# Patient Record
Sex: Male | Born: 1955 | Race: Black or African American | Hispanic: No | Marital: Married | Smoking: Never smoker
Health system: Southern US, Community
[De-identification: ages and names within clinical notes are randomized; demographics above are authoritative.]

## PROBLEM LIST (undated history)

## (undated) DIAGNOSIS — N186 End stage renal disease: Secondary | ICD-10-CM

## (undated) DIAGNOSIS — M329 Systemic lupus erythematosus, unspecified: Secondary | ICD-10-CM

## (undated) DIAGNOSIS — B021 Zoster meningitis: Secondary | ICD-10-CM

## (undated) DIAGNOSIS — I10 Essential (primary) hypertension: Secondary | ICD-10-CM

## (undated) DIAGNOSIS — B029 Zoster without complications: Secondary | ICD-10-CM

## (undated) DIAGNOSIS — I729 Aneurysm of unspecified site: Secondary | ICD-10-CM

## (undated) DIAGNOSIS — G629 Polyneuropathy, unspecified: Secondary | ICD-10-CM

## (undated) DIAGNOSIS — H209 Unspecified iridocyclitis: Secondary | ICD-10-CM

## (undated) DIAGNOSIS — M1990 Unspecified osteoarthritis, unspecified site: Secondary | ICD-10-CM

## (undated) DIAGNOSIS — R42 Dizziness and giddiness: Secondary | ICD-10-CM

## (undated) DIAGNOSIS — I2510 Atherosclerotic heart disease of native coronary artery without angina pectoris: Secondary | ICD-10-CM

## (undated) DIAGNOSIS — D649 Anemia, unspecified: Secondary | ICD-10-CM

## (undated) HISTORY — DX: End stage renal disease: N18.6

## (undated) HISTORY — PX: COLONOSCOPY: SHX174

## (undated) HISTORY — PX: VASECTOMY: SHX75

## (undated) HISTORY — DX: Zoster meningitis: B02.1

## (undated) HISTORY — DX: Dizziness and giddiness: R42

## (undated) HISTORY — DX: Anemia, unspecified: D64.9

## (undated) HISTORY — PX: OTHER SURGICAL HISTORY: SHX169

## (undated) HISTORY — DX: Aneurysm of unspecified site: I72.9

## (undated) HISTORY — DX: Polyneuropathy, unspecified: G62.9

## (undated) HISTORY — DX: Zoster without complications: B02.9

## (undated) HISTORY — DX: Essential (primary) hypertension: I10

## (undated) HISTORY — DX: Unspecified iridocyclitis: H20.9

## (undated) HISTORY — DX: Unspecified osteoarthritis, unspecified site: M19.90

## (undated) HISTORY — DX: Systemic lupus erythematosus, unspecified: M32.9

---

## 2000-01-04 DIAGNOSIS — M329 Systemic lupus erythematosus, unspecified: Secondary | ICD-10-CM

## 2000-01-04 HISTORY — DX: Systemic lupus erythematosus, unspecified: M32.9

## 2000-08-09 ENCOUNTER — Encounter: Payer: Self-pay | Admitting: Family Medicine

## 2000-08-09 ENCOUNTER — Encounter: Admission: RE | Admit: 2000-08-09 | Discharge: 2000-08-09 | Payer: Self-pay | Admitting: Family Medicine

## 2000-11-17 ENCOUNTER — Ambulatory Visit (HOSPITAL_COMMUNITY): Admission: RE | Admit: 2000-11-17 | Discharge: 2000-11-17 | Payer: Self-pay | Admitting: Oncology

## 2000-11-17 ENCOUNTER — Encounter (INDEPENDENT_AMBULATORY_CARE_PROVIDER_SITE_OTHER): Payer: Self-pay | Admitting: Specialist

## 2000-12-19 ENCOUNTER — Ambulatory Visit (HOSPITAL_BASED_OUTPATIENT_CLINIC_OR_DEPARTMENT_OTHER): Admission: RE | Admit: 2000-12-19 | Discharge: 2000-12-19 | Payer: Self-pay | Admitting: General Surgery

## 2000-12-19 ENCOUNTER — Encounter (INDEPENDENT_AMBULATORY_CARE_PROVIDER_SITE_OTHER): Payer: Self-pay | Admitting: *Deleted

## 2001-04-16 ENCOUNTER — Encounter (HOSPITAL_COMMUNITY): Payer: Self-pay | Admitting: Oncology

## 2001-04-16 ENCOUNTER — Ambulatory Visit (HOSPITAL_COMMUNITY): Admission: RE | Admit: 2001-04-16 | Discharge: 2001-04-16 | Payer: Self-pay | Admitting: Oncology

## 2001-04-19 ENCOUNTER — Encounter (INDEPENDENT_AMBULATORY_CARE_PROVIDER_SITE_OTHER): Payer: Self-pay | Admitting: Specialist

## 2001-04-19 ENCOUNTER — Ambulatory Visit (HOSPITAL_COMMUNITY): Admission: RE | Admit: 2001-04-19 | Discharge: 2001-04-19 | Payer: Self-pay | Admitting: Oncology

## 2001-04-19 ENCOUNTER — Encounter (HOSPITAL_COMMUNITY): Payer: Self-pay | Admitting: Oncology

## 2001-05-14 ENCOUNTER — Encounter: Payer: Self-pay | Admitting: Rheumatology

## 2001-05-14 ENCOUNTER — Encounter: Admission: RE | Admit: 2001-05-14 | Discharge: 2001-05-14 | Payer: Self-pay | Admitting: Rheumatology

## 2001-12-26 ENCOUNTER — Emergency Department (HOSPITAL_COMMUNITY): Admission: EM | Admit: 2001-12-26 | Discharge: 2001-12-26 | Payer: Self-pay | Admitting: Emergency Medicine

## 2001-12-26 ENCOUNTER — Encounter: Payer: Self-pay | Admitting: Emergency Medicine

## 2002-07-24 ENCOUNTER — Encounter: Payer: Self-pay | Admitting: Rheumatology

## 2002-07-24 ENCOUNTER — Encounter: Admission: RE | Admit: 2002-07-24 | Discharge: 2002-07-24 | Payer: Self-pay | Admitting: Rheumatology

## 2002-08-13 ENCOUNTER — Encounter: Admission: RE | Admit: 2002-08-13 | Discharge: 2002-08-13 | Payer: Self-pay | Admitting: Rheumatology

## 2002-08-13 ENCOUNTER — Encounter: Payer: Self-pay | Admitting: Rheumatology

## 2002-09-10 ENCOUNTER — Inpatient Hospital Stay (HOSPITAL_COMMUNITY): Admission: AD | Admit: 2002-09-10 | Discharge: 2002-09-13 | Payer: Self-pay | Admitting: Internal Medicine

## 2002-09-11 ENCOUNTER — Encounter: Payer: Self-pay | Admitting: Internal Medicine

## 2002-09-12 ENCOUNTER — Encounter: Payer: Self-pay | Admitting: Internal Medicine

## 2002-09-25 ENCOUNTER — Encounter: Admission: RE | Admit: 2002-09-25 | Discharge: 2002-09-25 | Payer: Self-pay | Admitting: Infectious Diseases

## 2002-10-02 ENCOUNTER — Encounter: Admission: RE | Admit: 2002-10-02 | Discharge: 2002-10-02 | Payer: Self-pay | Admitting: Infectious Diseases

## 2003-05-12 ENCOUNTER — Encounter: Admission: RE | Admit: 2003-05-12 | Discharge: 2003-05-12 | Payer: Self-pay | Admitting: Infectious Diseases

## 2003-11-26 ENCOUNTER — Ambulatory Visit (HOSPITAL_COMMUNITY): Admission: RE | Admit: 2003-11-26 | Discharge: 2003-11-26 | Payer: Self-pay | Admitting: Nephrology

## 2003-11-28 ENCOUNTER — Ambulatory Visit (HOSPITAL_COMMUNITY): Admission: RE | Admit: 2003-11-28 | Discharge: 2003-11-29 | Payer: Self-pay | Admitting: Nephrology

## 2003-11-28 ENCOUNTER — Encounter (INDEPENDENT_AMBULATORY_CARE_PROVIDER_SITE_OTHER): Payer: Self-pay | Admitting: *Deleted

## 2004-01-08 ENCOUNTER — Ambulatory Visit: Payer: Self-pay | Admitting: Infectious Diseases

## 2004-05-14 ENCOUNTER — Encounter (HOSPITAL_COMMUNITY): Admission: RE | Admit: 2004-05-14 | Discharge: 2004-08-12 | Payer: Self-pay | Admitting: Nephrology

## 2004-08-16 ENCOUNTER — Encounter (HOSPITAL_COMMUNITY): Admission: RE | Admit: 2004-08-16 | Discharge: 2004-11-14 | Payer: Self-pay | Admitting: Nephrology

## 2004-11-29 ENCOUNTER — Encounter (HOSPITAL_COMMUNITY): Admission: RE | Admit: 2004-11-29 | Discharge: 2005-02-27 | Payer: Self-pay | Admitting: Nephrology

## 2005-03-01 ENCOUNTER — Encounter (HOSPITAL_COMMUNITY): Admission: RE | Admit: 2005-03-01 | Discharge: 2005-05-30 | Payer: Self-pay | Admitting: Nephrology

## 2005-05-31 ENCOUNTER — Encounter (HOSPITAL_COMMUNITY): Admission: RE | Admit: 2005-05-31 | Discharge: 2005-08-29 | Payer: Self-pay | Admitting: Nephrology

## 2005-08-30 ENCOUNTER — Ambulatory Visit: Payer: Self-pay | Admitting: Infectious Diseases

## 2005-08-30 ENCOUNTER — Inpatient Hospital Stay (HOSPITAL_COMMUNITY): Admission: AD | Admit: 2005-08-30 | Discharge: 2005-09-05 | Payer: Self-pay | Admitting: Nephrology

## 2005-09-10 ENCOUNTER — Inpatient Hospital Stay (HOSPITAL_COMMUNITY): Admission: EM | Admit: 2005-09-10 | Discharge: 2005-09-16 | Payer: Self-pay | Admitting: Emergency Medicine

## 2005-09-13 ENCOUNTER — Ambulatory Visit: Payer: Self-pay | Admitting: Physical Medicine & Rehabilitation

## 2005-09-23 ENCOUNTER — Encounter (HOSPITAL_COMMUNITY): Admission: RE | Admit: 2005-09-23 | Discharge: 2005-09-23 | Payer: Self-pay | Admitting: Nephrology

## 2005-09-26 ENCOUNTER — Encounter: Admission: RE | Admit: 2005-09-26 | Discharge: 2005-10-24 | Payer: Self-pay | Admitting: Nephrology

## 2005-10-07 ENCOUNTER — Encounter (HOSPITAL_COMMUNITY): Admission: RE | Admit: 2005-10-07 | Discharge: 2006-01-05 | Payer: Self-pay | Admitting: Nephrology

## 2005-10-25 ENCOUNTER — Encounter: Admission: RE | Admit: 2005-10-25 | Discharge: 2005-11-11 | Payer: Self-pay | Admitting: Nephrology

## 2006-01-06 ENCOUNTER — Encounter (HOSPITAL_COMMUNITY): Admission: RE | Admit: 2006-01-06 | Discharge: 2006-04-06 | Payer: Self-pay | Admitting: Nephrology

## 2006-02-14 ENCOUNTER — Ambulatory Visit: Payer: Self-pay | Admitting: Vascular Surgery

## 2006-05-28 HISTORY — PX: AV FISTULA PLACEMENT: SHX1204

## 2006-05-31 ENCOUNTER — Ambulatory Visit: Payer: Self-pay | Admitting: Vascular Surgery

## 2006-05-31 ENCOUNTER — Ambulatory Visit (HOSPITAL_COMMUNITY): Admission: RE | Admit: 2006-05-31 | Discharge: 2006-05-31 | Payer: Self-pay | Admitting: Vascular Surgery

## 2006-06-23 ENCOUNTER — Encounter (HOSPITAL_COMMUNITY): Admission: RE | Admit: 2006-06-23 | Discharge: 2006-09-21 | Payer: Self-pay | Admitting: Internal Medicine

## 2006-07-25 ENCOUNTER — Ambulatory Visit: Payer: Self-pay | Admitting: Vascular Surgery

## 2006-09-25 ENCOUNTER — Encounter: Admission: RE | Admit: 2006-09-25 | Discharge: 2006-12-24 | Payer: Self-pay | Admitting: Internal Medicine

## 2006-12-09 ENCOUNTER — Emergency Department (HOSPITAL_COMMUNITY): Admission: EM | Admit: 2006-12-09 | Discharge: 2006-12-09 | Payer: Self-pay | Admitting: Emergency Medicine

## 2007-10-04 HISTORY — PX: FRACTURE SURGERY: SHX138

## 2009-04-05 ENCOUNTER — Emergency Department (HOSPITAL_COMMUNITY): Admission: EM | Admit: 2009-04-05 | Discharge: 2009-04-05 | Payer: Self-pay | Admitting: Emergency Medicine

## 2010-03-24 LAB — POCT I-STAT, CHEM 8
BUN: 48 mg/dL — ABNORMAL HIGH (ref 6–23)
Calcium, Ion: 1.16 mmol/L (ref 1.12–1.32)
Chloride: 104 mEq/L (ref 96–112)
Creatinine, Ser: 10.5 mg/dL — ABNORMAL HIGH (ref 0.4–1.5)
Glucose, Bld: 84 mg/dL (ref 70–99)
HCT: 36 % — ABNORMAL LOW (ref 39.0–52.0)
Hemoglobin: 12.2 g/dL — ABNORMAL LOW (ref 13.0–17.0)
Potassium: 4.9 mEq/L (ref 3.5–5.1)
Sodium: 139 mEq/L (ref 135–145)
TCO2: 30 mmol/L (ref 0–100)

## 2010-05-18 NOTE — Assessment & Plan Note (Signed)
OFFICE VISIT   Holquin, Neshawn L  DOB:  May 28, 1955                                       07/25/2006  CHART#:[id number]   Mr. Sleeper had an AV fistula in his left forearm (Cimino) created by me  on May 28 of this year.  The fistula is functioning nicely with an  excellent pulse and palpable thrill with no evidence of steal distally.  He does have lupus with some deformities of his fingers.  His vein is  enlarging nicely and should be excellent for use for dialysis if that  becomes necessary.  He will return to see [place] on a p.r.n. basis.   Nelda Severe Kellie Simmering, M.D.  Electronically Signed   JDL/MEDQ  D:  07/25/2006  T:  07/26/2006  Job:  [id number]   cc:   Darrold Span. Florene Glen, M.D.

## 2010-05-18 NOTE — Op Note (Signed)
NAME:  Stanley Stewart, Stanley Stewart          ACCOUNT NO.:  [id number]   MEDICAL RECORD NO.:  [id number]          PATIENT TYPE:  AMB   LOCATION:  SDS                          FACILITY:  [facility]   PHYSICIAN:  Nelda Severe. Kellie Simmering, M.D.  DATE OF BIRTH:  08/05/1955   DATE OF PROCEDURE:  05/31/2006  DATE OF DISCHARGE:                               OPERATIVE REPORT   PREOPERATIVE DIAGNOSIS:  End-stage renal disease.   POSTOPERATIVE DIAGNOSIS:  End-stage renal disease.   OPERATION:  Creation of a left radial artery to cephalic vein AV fistula  (Cimino shunt).   SURGEON:  Dr. Nelda Severe. Kellie Simmering.   FIRST ASSISTANT:  Faylene Million Dominick, PA.   ANESTHESIA:  Local.   PROCEDURE:  Patient taken to the operating room, placed in the supine  position at which time the left upper extremity was prepped with  Betadine scrub and solution and draped in a routine sterile manner.  After infiltration with 1% Xylocaine, a longitudinal incision was made  between the radial artery and cephalic vein at the wrist.  Cephalic vein  was dissected free, ligated distally and transected.  It was gently  dilated with heparinized saline and was a 2.5-3 mm vein distally.  Artery was exposed beneath the fascia, encircled with Vesseloops.  Artery was occluded with Vesseloops, opened with a 15 blade, extended  with Potts scissors.  It would easily accept a 2.5 mm dilator and had  excellent inflow.  Vein was carefully measured and spatulated,  anastomosed end-to-side with 6-0 Prolene.  Vesseloops released and there  was a good pulse and palpable thrill in the fistula.  The vein dilated  nicely two-thirds of the way up the forearm.  Adequate hemostasis was  achieved.  Wound closed in layers with Vicryl in a subcuticular fashion.  Sterile dressing applied.  The patient taken to the recovery in  satisfactory condition.      Nelda Severe Kellie Simmering, M.D.  Electronically Signed     JDL/MEDQ  D:  05/31/2006  T:  05/31/2006  Job:  [id number]

## 2010-05-21 NOTE — H&P (Signed)
NAMEAMOND, TOUTANT NO.:  [id number]   MEDICAL RECORD NO.:  [id number]          PATIENT TYPE:  INP   LOCATION:  [place]                         FACILITY:  [facility]   PHYSICIAN:  Sherril Croon, M.D.   DATE OF BIRTH:  Jul 07, 1955   DATE OF ADMISSION:  09/10/2005  DATE OF DISCHARGE:                                HISTORY & PHYSICAL   TIME OF ADMISSION:  7:00 in the evening.   This is a 55 year old African-American male discharged 09/06/2005 with  disseminated zoster infection treated with Valtrex, a 4-day history of  cramps, restless right lower leg pain, paresthesias, unable to walk, pain  unresponsive to Xanax described last night, had restless night, was  instructed to come to the emergency room for further evaluation, history of  systemic lupus, also a history cryptococcal meningitis, underwent a  __________  that showed pleomorphic cells positive for VZV on last CT scan  of August 29 which was negative.   PAST MEDICAL HISTORY:  1. Disseminated zoster 08/2005.  2. 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  3. History of anemia.  4. History of advanced renal failure.  5. History of cryptococcal meningitis.   MEDICATIONS:  1. Felodipine 19 mg q. day.  2. Prednisone 5 mg q. day.  3. Lasix 80 mg q. day.  4. Hydrochloroquine 200 mg b.i.d.  5. Valtrex 1 g daily.   ALLERGIES:  NO KNOWN DRUG ALLERGIES.   SOCIAL HISTORY:  Married, Jehovah's witness.  No alcohol, no tobacco.   FAMILY HISTORY:  No endstage renal disease.   REVIEW OF SYSTEMS:  GENERAL:  Denies fatigue, fever, sweats, chills.  EYES:  No visual complaints or visual loss.  EARS, NOSE, MOUTH THROAT:  No hearing  loss or epistaxis or sore throat.  CARDIOVASCULAR:  No anginal chest pain,  syncope orthopnea or PND.  RESPIRATORY:  No cough, wheeze, hemoptysis.  No  tobacco.  ABDOMEN:  No abdominal pain, nausea, vomiting or  diarrhea.  URO-  GENITAL:  No dysuria, urgency, frequency or hematuria.  NEUROLOGIC:  Positive for right-sided weakness, right-sided paresthesias.  DERMATOLOGY:  Skin rash right buttocks.  ENDOCRINE:  No diabetes, thyroid or adrenal  disease.  LUNGS:  Some steroid use.  MUSCULOSKELETAL:  Positive for lupus.  Has a bit forming arthropathy in the upper extremities.   PHYSICAL EXAMINATION:  GENERAL:  Alert, awake, follows commands  appropriately.  VITAL SIGNS:  Blood pressure 140/90, pulse of 75, temperature afebrile.  HEAD:  Normocephalic, atraumatic,, extraocular movements are intact.  Ears,  nose, back throat was normal and midline tongue.  NECK:  Supple.  No meningismus.  JVP not elevated.  CARDIOVASCULAR:  Regular rate and rhythm.  Faint systolic murmur 3/6 left  sternal edge, no rubs, no gallops.  RESPIRATORY:  Lungs fields are clear with no wheezes or rales.  ABDOMEN:  Soft, nontender with no organo-splenomegaly and normal bowel  sounds.  EXTREMITIES:  No cyanosis clubbing or edema.  DERMATOLOGIC:  Thin skin noted in the lower extremities.  NEUROLOGIC:  Pupillary response was equal and reactive.  Cranial nerves 2-6  were intact.  Face was midline with no weakness.  No sensory loss over the  facial dermatomes.  Cranial nerves 8, 9, 10, 11, 12 all intact.  EXTREMITIES:  Lower extremities revealed pathologically brisk reflexes in  the knees, ankles with upgoing Babinski's.  Vibration proprioception was  decreased in the right.  Sensory system was worse on the left than the right  to pinprick.   LABORATORY:  Pending.   ASSESSMENT/PLAN:  1. Appears to have acute spinal cord hemisection, level __________  lumbar      thoracic.  Will discuss the case with Dr. Gaynell Face.  He will evaluate      for MRI.  2. Hypertension, volume control.  3. Chronic renal failure, stage IV, no access.  no need for dialysis.  4. Anemia.  Aranesp 200 mcg weekly.  Check CBC __________  to       prescription.  Patient is noted to be Jehovah's Witness.      Sherril Croon, M.D.  Electronically Signed     MWW/MEDQ  D:  09/10/2005  T:  09/10/2005  Job:  [id number]

## 2010-05-21 NOTE — Consult Note (Signed)
NAME:  Stanley Stewart, MULLAN          ACCOUNT NO.:  [id number]   MEDICAL RECORD NO.:  [id number]          PATIENT TYPE:  INP   LOCATION:  3023                         FACILITY:  [facility]   PHYSICIAN:  Princess Bruins. Hickling, M.D.DATE OF BIRTH:  16-Dec-1955   DATE OF CONSULTATION:  09/10/2005  DATE OF DISCHARGE:                                   CONSULTATION   CHIEF COMPLAINT:  Jerky movements right leg, right leg weakness and  numbness.   [name] is a 55 year old gentleman with a five-year history of systemic  lupus erythematosus who has manifested itself with renal failure, rheumatoid  arthritis, anemia.  He has also had decreased resistance to infection.  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  He was recently hospitalized in late August with S1 zoster  and had a lumbar puncture that showed an elevated white count of 104, 7  polys, 65 lymphs, 25 monos, 3 eosinophils, red blood cell count 2.  Glucose  and protein were not done.  The patient had IgG, CSF herpes simplex 1-2 of  3.95 with index of less than 1.1 being in the normal range.  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  Herpes simplex 2 PCR was less than 0.9  which is in the normal range.  Suggested a herpes virus at some time in the  past.   The patient also has had significant problems with renal failure with  creatinine up to 5 in early August, having risen from 3.9 in July 12, 2005.  Smith antibody elevated at 601, RNP antibody at 156.  Anti-DNA elevated at  80 double-strand DNA elevated at 424, compliment was low with C3 68 and C4  13.   The patient was treated with intravenous acyclovir and was transitioned to  oral Valtrex after a couple of days.  Prednisone was continued and Plaquenil  was also continued.  The patient was given Aranesp for anemia of chronic  disease.  The patient's kidney function remained stable 4.9-5.0 range for  creatinine.   The  patient was discharged home September 05, 2005.  Two days later, he began  to experience myoclonic movements of his right leg and had increasing  difficulty walking on it.  He has had numbness at the sight of his zoster  and some pain in his leg which may be from repetitive flexion of the leg.   Medications include nifedipine 90 mg daily, prednisone 5 mg daily, Lasix 80  mg daily, hydroxychloroquine 200 mg twice a day (Plaquenil and Valtrex 1 gm  daily one more day treatment).   DRUG ALLERGIES:  None known.   Family history is unremarkable for lupus, end-stage renal disease, frequent  meningitis or myelitis.   SOCIAL HISTORY:  The patient is married.  He is a Jehovah witnessed.  Does  not use tobacco or alcohol.   REVIEW OF SYSTEMS:  The patient has not had fever, chills, no change in his  vision, no intercurrent infections in the head, neck, lungs, GI, GU.  He has  had no angina, syncope, orthopnea,  paroxysmal dyspnea.  No cough, no  wheezing.  He has had pain in his legs but no nausea, vomiting, diarrhea, or  dysuria and no problems with diabetes or thyroid disease.  Musculoskeletal  as noted above.   EXAMINATION:  Today a pleasant gentleman in no acute distress.  VITAL SIGNS:  Blood pressure 100/72, resting pulse 90, respirations 20,  oxygen saturation 97%, temperature 98.8.  NECK:  Supple.  No bruits.  No meningismus.  LUNGS:  Clear.  HEART:  No murmurs.  PULSES:  Normal.  ABDOMEN:  Soft.  Bowel sounds normal.  No hepatosplenomegaly.  EXTREMITIES:  The patient has significant rheumatoid deformity of his  fingers.  He has healing lesions in the S1 distribution of the right from  prior zoster.  MENTAL STATUS:  Alert without dysphasia.  CRANIAL NERVES:  Round reactive pupils.  Visual fields full.  Extraocular  movements full.  Symmetric facial strength.  Midline tongue.  Motor  examination normal strength in the upper extremities and left lower  extremity, 3/5 hip flexor, 4-/5  psoas in the right lower extremity, 5/5 knee  flexor, extensor, dorsiflexor and plantar flexor.  The patient has  hypesthesia in the right S1 dermatome.  No peripheral polyneuropathy.  No  sensory level with stereoagnosis.  Cerebellar examination:  Good finger-to-  nose.  Rapid, repetitive movements were slow.  Heel-knee-shin cannot be done  on the right.  Deep tendon reflexes were brisk.  There is extensive spread  from the right Achilles to the patella and across the adductor on the left.  The patient had bilateral flexor plantar responses, normal perianal  sensation, good cremasteric reflex and good rectal tone.   IMPRESSION:  The patient has weakness in his right lower extremity.  He has  hyperreflexia of the right Achilles without an extensor plantar response and  has evidence of right lower extremity segmental myoclonus.  There is no  signs of myelitis or plaquing.  The patient has not had seizures.  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  He is  not yet on dialysis.  Zoster was present in S1 dermatome and he had a  meningitis without encephalitis.  The patient has had cryptococcal  meningitis in the past.   PLAN:  1. EEG.  2. Will image the lumbosacral spine and thoracic spine with a MRI scan      without contrast, though I think it is probably a low yield study.  I      do not see a reason to treat his myoclonus at this time.  I know that      he is uncomfortable and it is causing some pain as well as insomnia.  I      am not certain that we can bring it under control at this time.  I      appreciate the opportunity to participate in his care.      Princess Bruins. Gaynell Face, M.D.  Electronically Signed     WHH/MEDQ  D:  09/10/2005  T:  09/11/2005  Job:  [id number]   cc:   Sherril Croon, M.D.

## 2010-05-21 NOTE — Op Note (Signed)
   NAME:  Stanley Stewart, Stanley Stewart                    ACCOUNT NO.:  [id number]   MEDICAL RECORD NO.:  [id number]                   PATIENT TYPE:  INP   LOCATION:  3039                                 FACILITY:  [facility]   PHYSICIAN:  Legrand Como L. Doy Mince, M.D.           DATE OF BIRTH:  06-Oct-1955   DATE OF PROCEDURE:  09/10/2002  DATE OF DISCHARGE:                                 OPERATIVE REPORT   PROCEDURE PERFORMED:  Diagnostic lumbar puncture.   OPERATOR:  Michael L. Doy Mince, M.D.   INDICATIONS FOR PROCEDURE:  Headache, fever, possible meningitis or  vasculitis.   DESCRIPTION OF PROCEDURE:  Informed consent was obtained after the  procedure, risks and benefits were explained to the patient and he agreed to  proceed.  The patient was placed in the right lateral decubitus position and  prepped and draped in the usual sterile fashion.  Local anesthesia was  achieved with 97mL of lidocaine.  A 20 gauge spinal needle was inserted into  the L4-5 interspace and advanced until clear CSF was returned.  Opening  pressure was measured at 290 mmH2O.  Approximately 67mL of CSF were obtained  and sent for laboratory analysis as follows:  tube #1 2mL cell count,  differential, cryptococcal antigen; tube #2 30mL, glucose and protein; tube  #3 24mL, Gram stain, routine culture, AFB stain and culture, fungal stain and  culture; tube #4 24mL, VDRL, any additional tests deemed necessary.  Closing  pressure was measured at 195 mmH2O.  Needle was withdrawn, hemostasis  obtained, no immediate complications were noted.  Patient was advised to  remain supine for three or four hours following the procedure and will be  monitored by floor nursing staff.                                               Michael L. Doy Mince, M.D.    MLR/MEDQ  D:  09/10/2002  T:  09/11/2002  Job:  [id number]

## 2010-05-21 NOTE — Discharge Summary (Signed)
NAME:  Stanley Stewart, Stanley Stewart          ACCOUNT NO.:  [id number]   MEDICAL RECORD NO.:  [id number]          PATIENT TYPE:  INP   LOCATION:  6702                         FACILITY:  [facility]   PHYSICIAN:  Alvin C. Florene Glen, M.D.  DATE OF BIRTH:  01-Jun-1955   DATE OF ADMISSION:  08/30/2005  DATE OF DISCHARGE:  09/05/2005                                 DISCHARGE SUMMARY   DISCHARGE DIAGNOSES:  1. Herpes zoster meningitis.  2. Systemic lupus erythematous.  3. Anemia of chronic disease.  4. Chronic kidney disease.   PROCEDURE:  1. 08/29/2005:  Lumbar puncture by Dr. Purcell Nails.  2. 08/30/2005:  CT of head without contrast.  Impression: 1.  No acute      intracranial abnormalities.  No mass lesion or midline shift.  2.      Moderate changes of small vessel disease of the white matter diffusely.   CONSULTATIONS:  1. Dr. Lars Mage.  2. Dr. Knute Neu.   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  He also has a history of Crytococcal meningitis in September of  2004 and was treated with Diflucan at that time.  He never had a repeat  lumbar puncture.  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  On May 05, 2005 the CellCept  was finally discontinued.  Diflucan was stopped simultaneously.  Patient has  been currently maintained on Prednisone at 5 mg per day alternating with 2.5  mg per day.  For several weeks, he has complained of increased malaise,  increased arthralgias, recent low grade temperature.  He was seen by Dr.  Rockwell Alexandria on August 8 and labs at that time revealed a serum creatinine of up  to 5.  It had been 3.9 on July 10.  Smith antibody was elevated at 601,  RMP  antibody was elevated at 156, anti-double strength DNA was elevated at 424.  C3 was low at 68 and C4 was 13.  He complained of a dull headache, some  discomfort in his neck and a feeling like he did before when he had  meningitis.  He complained of some nausea and vomiting this morning as well  as chills.   LABORATORY DATA:  WBC 5.7, hemoglobin 13.1, hematocrit 42.1, platelets  201,000.  Potassium 5.3, creatinine 4.9.   HOSPITAL COURSE:  1. Herpes zoster meningitis:  We received an Infectious Disease consult on      08/30/05 from Dr. Lars Mage.  At that time, a lumbar puncture was      performed and cultures showed no organisms and predominantly      mononuclear cells.  Patient also had a CT head with results above.      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      Urine culture was performed with no growth. Blood  cultures were      performed with no growth.  Recommendations included Acyclovir at 10 mg      per kilogram IV q.24h.  For his creatinine clearance that was      approximately 5-10 cc.  The patient did well with the therapy.  He      subsequently had no pain or irritation.  He was placed on p.o. Valtrex      on 9/2 and he will continue this for seven days.  Please note that this      is recommendations per Infectious Disease.  2. Systemic lupus erythematous:  Patient continued on his Plaquenil and      Prednisone at his usual outpatient dose.  Dr. Knute Neu was      consulted on August 31, 2005.  He agreed with cultures to include blood      cultures and CSF cultures which are stated in hospital course #1.  C3      and C4 counts were done.  C3 count was 55.  I do not have C4 count at      time of discharge.  Dr. Rockwell Alexandria did recommend continuing Prednisone at      his usual dose of 5 mg q. Day and Plaquenil 200 mg b.i.d.  He did not      see any clinical evidence for higher steroid dose at this time.  He      also agreed with  continuation of his IV Acyclovir as per Dr. Orene Desanctis.  It      was also noted that the patient's current presentation upon admission      was secondary to herpes zoster and he felt that he may also have some      active lupus flare given his joint and muscle pain.  3. Anemia of chronic disease:  Patient's hemoglobin remained stable in the      12s.  He received his weekly Aranesp injection.  He was also given IV      doses of iron this admission.  4. Chronic kidney disease:  The patient's serum creatinine stayed in the      4.9-5.0 range.  It was 5.1 on 09/04/05.  Patient was educated with      videos.  He did receive vein mapping to assess for further access.  The      vein mapping showed left cephalic patent without significant wall      thinning.  That was done on the left arm.  Again no further      intervention.   DISCHARGE MEDICATIONS:  The patient will continue his outpatient  medications.  New medications will include:  1. Valtrex 1 gram p.o. daily for seven days.  2. Darvocet N-100 one every four hours as needed for pain. (prescription      was given for #20).   DISCHARGE INSTRUCTIONS:  Patient did meet with the nutritionist here in the  hospital; however, I will contact one of the medical assistants at our  office and have them send over information on low potassium and low  phosphorus diets for the patient and his spouse to review.  He also needs to  followup with Dr. Florene Glen in one week.  I have given the receptionist a call  who will contact the patient with this appointment I believe on September  10.  He will increase his activity slowly and give the office a call if  there is any concerns or questions.  Of note, the patient did receive his  Aranesp injection prior to discharge which was one day earlier than  scheduled.      Leafy Kindle, PA      [place] C. Florene Glen, M.D.  Electronically Signed   MY/MEDQ  D:  09/06/2005  T:  09/06/2005  Job:  [id number]   cc:   Alison Murray, M.D.

## 2010-05-21 NOTE — Procedures (Signed)
EEG NUMBER:  [id number]   HISTORY:  This is a 55 year old with a history of right lower extremity  weakness and segmental myoclonus associated with a herpes zoster infection.  The patient is being evaluated for possible seizure-type events.  Medications include valacyclovir, prednisone, Lasix, Plaquenil, Xanax,  Procardia, Tylox.  This is a routine EEG.  No skull defects are noted.   EEG CLASSIFICATION:  Dysrhythmia grade 1, generalized.   DESCRIPTION OF RECORDING:  The background rhythm of this recording consists  of a fairly well modulated medium amplitude alpha rhythm of 8 Hz that is  reactive to eye opening and closure.  As the record progresses, intermittent  generalized theta frequency slowing is seen off and on.  Slowing occurs in 1-  to 2-second durations with normal-appearing activity between episodes of  slowing.  Photic stimulation is performed resulting in a bilateral and  symmetric photic driving response.  Hyperventilation is then performed,  resulting in a minimal buildup of background rhythm activities without much  alteration in the generalized slowing as described above.  At no time during  the recording does there appear to be evidence of spike or spike-wave  discharges or evidence of focal slowing.  EKG monitor shows no evidence of  cardiac rhythm abnormalities with a heart rate of 90.   IMPRESSION:  This is a mildly abnormal EEG recording due to intermittent  generalized slowing.  Such recording is nonspecific and can be seen with any  process resulting in a toxic or metabolic encephalopathy.  No clear  epileptiform discharges are seen.      Jill Alexanders, M.D.  Electronically Signed     [id number]  D:  09/12/2005 16:40:16  T:  09/13/2005 11:27:21  Job #:  [id number]

## 2010-05-21 NOTE — H&P (Signed)
NAME:  Stanley Stewart, Stanley Stewart          ACCOUNT NO.:  [id number]   MEDICAL RECORD NO.:  [id number]          PATIENT TYPE:  INP   LOCATION:  6702                         FACILITY:  [facility]   PHYSICIAN:  Alvin C. Florene Glen, M.D.  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  He never had a repeat lumbar puncture.  The patient  was treated with prednisone and subsequently CellCept through 2006 for lupus  nephritis and during that period of time received prophylactic therapy with  Diflucan.  On May 05, 2005, the CellCept was finally discontinued.  Diflucan  was stopped simultaneously.  The patient has been currently maintained on  prednisone at 5 mg per day, alternating with 2.5 mg per day.  For several  weeks, he has complained of increased malaise, increased arthralgias, recent  low grade temperature.  He was seen by Dr. Rockwell Alexandria on August 8th, and labs  at that time revealed a serum creatinine up to 5.  It had been 3.9 on July  10th.  Smith antibody was elevated at 601, RNP antibody was elevated at 156,  anti-double strength DNA was elevated at 424.  C3 was low at 68 (90-180) and  C4 was 13 (9-36).  He complains of a dull headache, some discomfort in his  neck, and a feeling like he did before when he had meningitis.  He  complained of some nausea and vomiting this morning as well as chills.   CURRENT MEDICATIONS:  1. Ferrous sulfate b.i.d.  2. Hydroxychloroquine 200 mg b.i.d.  3. Prednisone 5 mg alternating with 2.5 mg daily.  4. Furosemide 80 mg per day.  5. Nifedipine 90 mg per day.  6.  Calcium daily.  7. Vitamin C.  8. Multivitamins.   PAST MEDICAL HISTORY:  SLE, nephritis, cryptococcal meningitis in September,  2004, anemia.  He is a Restaurant manager, fast food.  Arthritis with deformities,  hypertension, peripheral neuropathy.   REVIEW OF SYSTEMS:  He also has complained of onset this morning of right  buttock rash that he felt when taking a shower.  It is nonpainful.   PHYSICAL EXAMINATION:  VITAL SIGNS:  Temperature 100.2 degrees, blood  pressure 148/100.  Weight is 138 pounds.  GENERAL:  A slightly ill-appearing African-American male.  HEENT:  There is slight nuchal rigidity.  LUNGS:  Clear.  HEART:  Regular rate and rhythm.  ABDOMEN:  Soft.  EXTREMITIES:  With deformities of the hands.  No significant edema.  SKIN:  Buttocks with multiple bullae and blisters on the right cheek in a  dermatomal distribution.  NEUROLOGIC:  He is oriented in all spheres.   ASSESSMENT:  1. Fever, headache, mild nuchal rigidity with a history of cryptococcal      meningitis, rule  out recurrent cryptococcal meningitis, rule out herpes      encephalitis.  2. Right buttock rash:  Rule out herpes zoster.  3. Renal failure secondary to SLE with question of flare.  4. Rule out systemic lupus erythematosus flare.  5. Anemia:  Continue Aranesp.  6. Jehovah's Witness, refuses blood transfusion.   PLAN:  1. Admit.  2. Infectious disease consult.  3. Rheumatology consult.  4. Lumbar puncture.      Stanley Stewart. Florene Glen, M.D.  Electronically Signed     ACP/MEDQ  D:  08/30/2005  T:  08/30/2005  Job:  [id number]
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
## 2010-05-21 NOTE — Op Note (Signed)
[place]. [facility]  Patient:    JESUSMANUEL, KENWORTHY Visit Number: [id number] MRN: [id number]          Service Type: DSU Location: [facility] Attending Physician:  Earl Gala Dictated by:   Rudell Cobb. Annamaria Boots, M.D. Proc. Date: 12/19/00 Admit Date:  12/19/2000   CC:         Jeanie Cooks, M.D.                           Operative Report  PREOPERATIVE DIAGNOSIS:  Lupus erythematosus and persistent fever along with generalized lymphadenopathy.  POSTOPERATIVE DIAGNOSIS:  Lupus erythematosus and persistent fever along with generalized lymphadenopathy.  PROCEDURE:  Left axillary lymph node biopsy.  SURGEON:  Rudell Cobb. Annamaria Boots, M.D.  ANESTHESIA:  General.  DESCRIPTION OF PROCEDURE:  The patient was placed on the operating table after general anesthesia had been induced.  The left arm extended on the arm board and the left axilla was prepped and draped in the usual fashion.  A transverse left axillary incision was made with dissection through the subcutaneous tissue through the clavipectoral fascia.  There was a large cluster of enlarged but benign-looking lymph nodes that was removed.  Hemostasis obtained with the cautery.  The long thoracic and thoracodorsal nerves were identified and preserved.  With good hemostasis, we closed the incision with 3-0 Vicryl and staples.  Specimen submitted to the pathologist in a fresh state. Dressing applied and the patient transferred to the recovery room in satisfactory condition, having tolerated the procedure well. Dictated by:   Rudell Cobb. Annamaria Boots, M.D. Attending Physician:  Earl Gala DD:  12/19/00 TD:  12/19/00 Job: [id number]

## 2010-05-21 NOTE — Consult Note (Signed)
NAME:  Stanley Stewart, Stanley Stewart                    ACCOUNT NO.:  [id number]   MEDICAL RECORD NO.:  [id number]                   PATIENT TYPE:  INP   LOCATION:  3039                                 FACILITY:  [place]   PHYSICIAN:  Legrand Como L. Doy Mince, M.D.           DATE OF BIRTH:  February 16, 1955   DATE OF CONSULTATION:  09/10/2002  DATE OF DISCHARGE:                                   CONSULTATION   REFERRING PHYSICIAN:  Ander Slade. Rockwell Alexandria, M.D./Karrar Lysle Rubens, M.D.   REASON FOR EVALUATION:  Headache.   HISTORY OF PRESENT ILLNESS:  This is an inpatient consultation evaluation of  this existing [facility] patient.  A 55 year old right-  handed man was seen on 2 previous occasions in the office by Dr. Leonie Man for  persistent headache.  The patient reports he has had a fairly severe  persistent headache going on for about 8 weeks.  It usually is frontal and  low level, but can often become very severe, at which times it becomes  global and is often associated with some stiffness of the neck.  With a  severe headache he also reports feeling some nausea, but does not experience  very much photophobia.  He also has a little bit of low back pain when the  neck is really bothering him.  He saw Dr. Rockwell Alexandria about this, who initially  prescribed Darvocet, which is only of some help.  Around the time he  developed the headache, he also began to have intermittent fevers in the 99  to 100 range.  He saw Dr. Rockwell Alexandria and was prescribed Darvocet, which was of  some help, but the headache persisted.  He also was tried on courses of  antibiotics, including Avalox after CT sinus imaging revealed some  abnormalities, but this did not help much.  He was evaluated by Dr. Leonie Man on  08/30/2002 and was felt to likely have analgesic redundant headache and was  advised to discontinue his analgesics including Darvocet and Excedrin.  He  was placed on amitriptyline.  However, his headaches continued to  worsen.  Over concern of his persistent headaches as well as intermittent fevers and  his immunocompromised state, he is admitted for further work-up and  diagnostic considerations.  Presently, he is resting fairly comfortably,  although he does complain of a frontal headache.  He is not presently  nauseous.   PAST MEDICAL HISTORY:  Remarkable for:  1. Systemic lupus erythematous, for which he is chronically followed by Dr.     Rockwell Alexandria.  This diagnosis was made in August 2002 and manifestations of it     include arthralgias, fatigues, bone marrow suppression, left pleural     effusion, proteinuria, and presence of anticardiolipin antibody.  2. He also has persistently elevated amylase levels without evidence of     pancreatitis.  3. He is treated for hypertension.   FAMILY AND SOCIAL AND REVIEW OF SYSTEMS:  Per ADMISSION H&P.  MEDICATIONS:  1. Chronically he takes Plaquenil 200 mg b.i.d.  2. Prednisone 7.5 mg q.d.  3. Diovan.  4. Norvasc.  5. Calcium and multivitamins.  6. He has also been received Ultracet for his headache and in the hospital     has received intravenous Demerol.   PHYSICAL EXAMINATION:  VITAL SIGNS:  Temperature 101.8.  Blood pressure  120/70.  Pulse 96.  Weight 148 lb.  GENERAL:  This is a thin somewhat ill appearing man in no evidence of  distress lying supine in the hospital bed.  HEAD:  Cranium is normocephalic and atraumatic.  Oropharynx is benign.  NECK:  Little bit stiff with restriction of forward flexion and complaint of  back pain with forward flexion, but there is no true to Kernig or Brudzinski  sign.  NEUROLOGIC:  Mental status:  He is awake, alert, and oriented.  Speech is  fluent and not dysarthric and is appropriate for content.  He has no defects  to confrontational naming.  Cranial nerves:  Funduscopic exam was benign.  Pupils equal and reactive.  Extraocular movements full without nystagmus.  Face, tongue, and palate move normally and  symmetrically.  Motor:  Normal  bulk and tone.  Normal strength of all tested extremity muscles.  Sensation:  Intact to light-touch throughout.  Reflexes 2+ and symmetric.  Toes are  downgoing.  Cerebellar:  Finger-to-nose is performed well.  Gait:  He  complains of a little bit dizziness and light-headedness, but he is  ambulatory and his gait is relatively steady.   LABORATORY REVIEW:  Pending laboratory studies at this time include:  CBC.  CMET.  CPK.  C3 anti-DNA antibody.  Sedimentation rate.  Throat culture for  beta strep.  Urinalysis, urine culture, and blood culture x 2.  Chest x-ray  and MRI to brain with and without contrast are also pending.   PERTINENT LABORATORY STUDIES:  Performed 08/28/2002 include:  Normal liver  enzymes and albumin.  Bottom level of normal compliment levels.  Normal  urinalysis.  Elevated anticardiolipin antibody titers.  Negative blood  culture x 2.  Normal renal function.  Low hemoglobin of 11.5 with white  count 4.4, and platelet count of 197,000.  Elevated sed rate of 66.  Positive double-stranded DNA antibody titer at 1:640.  Negative urine  culture.   IMPRESSION:  Persistent headache and intermittent fever in the setting of  chronic immunosuppression due to lupus.  Concerns are raised about possible  low-grade infection, possible vasculitis.  While lupus may be the source of  his fever, he also needs a work-up for fever of unknown origin.   PLAN:  We will proceed with diagnostic lumbar puncture as well as MRI of the  brain with contrast.  Depending on the results of these, he may need further  studies including transesophageal echo and the like.  We will defer that to  his primary neurologist, Dr. Leonie Man, who will be evaluating him tomorrow  morning.                                               Michael L. Doy Mince, M.D.    MLR/MEDQ  D:  09/10/2002  T:  09/11/2002  Job:  [id number]  cc:   Ander Slade. Rockwell Alexandria, M.D.  [address]  [place] [address]  Fax: [phone]  [name], M.D.  [address]  [place] [id number]  Fax: [phone]   [place]. Leonie Man, MD  Fax: [phone]

## 2010-05-21 NOTE — Discharge Summary (Signed)
NAMEHAITHAM, ELRICK NO.:  [id number]   MEDICAL RECORD NO.:  [id number]          PATIENT TYPE:  REC   LOCATION:  [facility]H                         FACILITY:  [place]   PHYSICIAN:  Sherril Croon, M.D.   DATE OF BIRTH:  11/19/1955   DATE OF ADMISSION:  09/26/2005  DATE OF DISCHARGE:  10/16/2005                                 DISCHARGE SUMMARY   ADMITTING DIAGNOSES:  1. Right lower extremity weakness.  2. Hyperreflexia.  3. Right lower extremity segmental myoclonus.  4. Chronic kidney disease stage IV.  5. Anemia of chronic disease.  6. Hypertension.  7. Systemic lupus erythematosus.  8. Disseminated zoster.  9. History of recent cryptococcal meningitis.   DISCHARGE DIAGNOSES:  1. Right lower extremity weakness and myoclonus secondary to sequelae,      varicella zoster virus treated with Depakote.  2. Chronic kidney disease stage IV.  3. Systemic lupus erythematosus.  4. Anemia of chronic disease.  5. History of recent cryptococcal meningitis.  6. Disseminated zoster.  7. Hypertension.   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  He is now  being admitted complaining of a restless right leg pain and paresthesias and  states he is unable to walk.  He has had these symptoms for 4 days along  with cramping.  His pain has been unresponsive to the Xanax he is been  taking at night.  He is admitted now for workup.   LABORATORY DATA:  Labs on admission:  White count 8300, hemoglobin 12.5,  platelets 345,000.  Sodium 132, potassium 5.3, chloride 102, CO2 is 22,  glucose 104, BUN 59, creatinine 4.7, albumin 2.7.  LFTs within normal  limits, phosphorus 4.5, TSH 0.568.   HOSPITAL COURSE:  An MRI was obtained on his thoracic lumbar spine.  There  were no abnormalities seen.  He did have changes of ordinary  spondylosis in  the cervical spine, but otherwise there was no cord lesion evident.  Neurology was consulted and felt that specifically the patient had weakness  of the right lower leg along with right hyperreflexia and segmental  myoclonus.  EEG was ordered, however, results are not in the chart at time  of this dictation.  He was started on Depakote at 250 mg b.i.d., and given  Tylox 1-2 tablets every 6 hours for pain control.  Other workup included  complement levels which were both low with C3 at 57 and C4 at 13.  Anti-DNA  was elevated at 26, and ANA titer was elevated with a ratio of 1:320 in a  speckled pattern, all consistent with his diagnosis of lupus.  With Depakote  on board, gradually his myoclonus, hyperreflexia and paresthesias improved  dramatically.  He was able to ambulate and discharged considerably better  and without side effects of Depakote.  He will follow up with Dr. Gaynell Face  as an outpatient p.r.n. and continue with his previous outpatient medicines.   DISCHARGE MEDICATIONS:  1. Prednisone 5 mg daily.  2. Plaquenil 200 mg b.i.d.  3. Lasix 80 mg daily.  4. Depakote 250 mg b.i.d.  5. Nifedipine ER 90 mg daily.  6. Xanax 0.25 mg at bedtime.  7. Tylox 1-2 pills q.h.s. p.r.n. leg muscle pain, #20.   FOLLOWUP:  Follow up with Dr. Abel Presto office for next scheduled renal  appointment.      Nonah Mattes, P.A.      Sherril Croon, M.D.  Electronically Signed    RRK/MEDQ  D:  10/16/2005  T:  10/17/2005  Job:  [id number]   cc:   Princess Bruins. Gaynell Face, M.D.

## 2010-05-21 NOTE — Discharge Summary (Signed)
   NAME:  Stanley Stewart, Stanley Stewart                    ACCOUNT NO.:  [id number]   MEDICAL RECORD NO.:  [id number]                   PATIENT TYPE:  INP   LOCATION:  3039                                 FACILITY:  [place]   PHYSICIAN:  [name], M.D.                 DATE OF BIRTH:  1955/04/25   DATE OF ADMISSION:  09/10/2002  DATE OF DISCHARGE:  09/13/2002                                 DISCHARGE SUMMARY   DISCHARGE DIAGNOSES:  1. Cryptococcal meningitis.  2. Systemic lupus erythematosus.  3. Hypertension.   CONDITION ON DISCHARGE:  Stable.   FOLLOW UP:  Followup with Dr. Johnnye Sima, infectious disease, in the clinic.  Followup with Dr. Lysle Rubens in one to two weeks and Dr. Knute Neu for lupus  as scheduled.   MEDICATIONS AT DISCHARGE:  1. Norvasc 5 mg q.d.  2. Diovan HCT 80/12.5 one a day.  3. Ultram 50 mg p.r.n.  4. Plaquenil 200 mg b.i.d.  5. Prednisone 5 mg q.d.  6. Multivitamin.  7. Amphotericin B IV infusions daily with home health nursing.  8. P.r.n. medications Tylenol and Benadryl.  9. Ativan 1 mg p.r.n.  10.      Oxycodone 5 mg q.6h. p.r.n. for pain.   Blood chemistries:  Renal function to be drawn every day by the home health  nurse.   HOSPITAL COURSE:  Please see h&P for details. The patient is 55 year old  African-American male with history of systemic lupus erythematosus, came  admitted with persistent headaches, intermittent nausea, and fevers.   Infectious disease. The patient had a head CT which was negative. Has a MRI  which was negative. As far as he underwent a spinal tap to rule out any  meningitis and vasculitis. The patient had cryptococcal antigen positive in  the CSF fluid, otherwise, unremarkable. His gram stain was negative and his  sed rate was 108. His blood cultures remained negative. The patient was  started on amphotericin after PICC line was placed. His renal function  initially was normal as far as 1.3, went up to 1.6, and subsequently his  amphotericin B dose was made every other day, and the patient was discharged  with home health for therapy for cryptococcal. The plan was four weeks of  amphotericin  B then subsequently Diflucan p.o. 400 mg daily and follow with the ID. The  patient's headaches got better, still had some. As far as his anemia,  remained stable, and his temperatures did drop down to 99. As far as  hypertension, remained stable.                                                [name], M.D.    Veatrice Bourbon  D:  10/10/2002  T:  10/11/2002  Job:  [id number]

## 2010-06-08 ENCOUNTER — Encounter (INDEPENDENT_AMBULATORY_CARE_PROVIDER_SITE_OTHER): Payer: Medicare Other | Admitting: Vascular Surgery

## 2010-06-08 DIAGNOSIS — N186 End stage renal disease: Secondary | ICD-10-CM

## 2010-06-08 DIAGNOSIS — T82898A Other specified complication of vascular prosthetic devices, implants and grafts, initial encounter: Secondary | ICD-10-CM

## 2010-06-09 NOTE — Consult Note (Signed)
NEW PATIENT CONSULTATION  Armetta, CHRISTOPHE L DOB:  1955/10/12                                       06/08/2010 CHART#:[id number]  The patient is a 55 year old male patient on hemodialysis via left Cimino fistula which I have created in 2008.  The fistula has functioned well over the last 4 years but has diffusely enlarged and he was referred today for evaluation of possible pseudoaneurysm.  He denies any pain or numbness in the hand and also states that his arm has never had any skin breakdown or bleeding.  CHRONIC MEDICAL PROBLEMS: 1. Hypertension. 2. Arthritis. 3. End-stage renal disease.  SOCIAL HISTORY:  The patient married, has 2 children, is retired.  Does not use tobacco or alcohol.  REVIEW OF SYSTEMS:  Denies any chest pain, dyspnea on exertion, PND, orthopnea.  Does have arthritis joint pain and chronic renal insufficiency.  All other systems are negative.  PHYSICAL EXAMINATION:  Blood pressure 130/79, heart rate 93, respirations 14.  General:  He is a well-developed, well-nourished male in no apparent distress.  HEENT:  Exam normal for age.  EOMs intact. Upper extremities:  Exam reveals the left arm to have a radial artery to cephalic vein AV fistula with a diffusely dilated cephalic vein up to the antecubital area measuring a maximum 2.5 to 3 cm in diameter.  There is no evidence of an ulceration or previous sites of bleeding.  It is nontender with no focal aneurysmal dilatation.  He has a diffusely dilated left forearm AV fistula which I think can still be used for quite some time unless it has any skin breakdown or bleeding.  I think the puncture site should be located so they are not using the same site constantly and hopefully he can have more time with this fistula for his hemodialysis before converting him to an upper arm fistula.  He will return to see [place] on a p.r.n. basis .    Nelda Severe Kellie Simmering, M.D. Electronically  Signed  JDL/MEDQ  D:  06/08/2010  T:  06/09/2010  Job:  [id number]

## 2010-10-11 LAB — I-STAT 8, (EC8 V) (CONVERTED LAB)
Acid-base deficit: 6 — ABNORMAL HIGH
BUN: 126 — ABNORMAL HIGH
Bicarbonate: 19 — ABNORMAL LOW
Chloride: 108
Glucose, Bld: 94
HCT: 45
Hemoglobin: 15.3
Operator id: 294521
Potassium: 4.3
Sodium: 137
TCO2: 20
pCO2, Ven: 36.4 — ABNORMAL LOW
pH, Ven: 7.325 — ABNORMAL HIGH

## 2010-10-11 LAB — CBC
HCT: 38.5 — ABNORMAL LOW
Hemoglobin: 12 — ABNORMAL LOW
MCHC: 31.2
MCV: 71.8 — ABNORMAL LOW
Platelets: 194
RBC: 5.36
RDW: 18.5 — ABNORMAL HIGH
WBC: 5.9

## 2010-10-11 LAB — DIFFERENTIAL
Basophils Absolute: 0
Basophils Relative: 1
Eosinophils Absolute: 0 — ABNORMAL LOW
Eosinophils Relative: 1
Lymphocytes Relative: 6 — ABNORMAL LOW
Lymphs Abs: 0.4 — ABNORMAL LOW
Monocytes Absolute: 0.6
Monocytes Relative: 11
Neutro Abs: 4.8
Neutrophils Relative %: 82 — ABNORMAL HIGH

## 2010-10-11 LAB — POCT CARDIAC MARKERS
CKMB, poc: 2.2
Myoglobin, poc: 393
Operator id: 294521
Troponin i, poc: <0.05

## 2010-10-11 LAB — POCT I-STAT CREATININE
Creatinine, Ser: 8.5 — ABNORMAL HIGH
Operator id: 294521

## 2010-10-12 LAB — IRON AND TIBC: Iron: 34 — ABNORMAL LOW

## 2010-10-12 LAB — CBC
HCT: 41.9
Hemoglobin: 13
MCHC: 31
MCV: 73.6 — ABNORMAL LOW
Platelets: 236
RBC: 5.7
RDW: 19 — ABNORMAL HIGH
WBC: 6.2

## 2010-10-12 LAB — RENAL FUNCTION PANEL
Albumin: 3.3 — ABNORMAL LOW
CO2: 24
Calcium: 8.8
GFR calc Af Amer: 9 — ABNORMAL LOW
GFR calc non Af Amer: 7 — ABNORMAL LOW
Phosphorus: 5.1 — ABNORMAL HIGH
Sodium: 136

## 2010-10-12 LAB — FERRITIN: Ferritin: 383 — ABNORMAL HIGH (ref 22–322)

## 2010-10-14 LAB — CBC
HCT: 36.7 — ABNORMAL LOW
Platelets: 243
WBC: 6.3

## 2010-10-14 LAB — IRON AND TIBC
Iron: 31 — ABNORMAL LOW
Saturation Ratios: 15 — ABNORMAL LOW
TIBC: 206 — ABNORMAL LOW
UIBC: 175

## 2010-10-18 LAB — RENAL FUNCTION PANEL
CO2: 21
GFR calc Af Amer: 11 — ABNORMAL LOW
GFR calc non Af Amer: 9 — ABNORMAL LOW
Glucose, Bld: 107 — ABNORMAL HIGH
Potassium: 4.7
Sodium: 139

## 2010-10-18 LAB — CBC
HCT: 25.1 — ABNORMAL LOW
MCHC: 31
MCV: 78.4
Platelets: 233
RBC: 3.2 — ABNORMAL LOW
RDW: 18.4 — ABNORMAL HIGH
WBC: 5

## 2010-10-18 LAB — IRON AND TIBC
Iron: 29 — ABNORMAL LOW
Iron: 46
Saturation Ratios: 25
TIBC: 226
UIBC: 138

## 2010-10-18 LAB — FERRITIN: Ferritin: 1056 — ABNORMAL HIGH (ref 22–322)

## 2011-08-23 ENCOUNTER — Other Ambulatory Visit: Payer: Self-pay

## 2011-08-23 DIAGNOSIS — T82898A Other specified complication of vascular prosthetic devices, implants and grafts, initial encounter: Secondary | ICD-10-CM

## 2011-09-07 ENCOUNTER — Encounter: Payer: Self-pay | Admitting: Vascular Surgery

## 2011-09-12 ENCOUNTER — Encounter: Payer: Self-pay | Admitting: Vascular Surgery

## 2011-09-13 ENCOUNTER — Ambulatory Visit (INDEPENDENT_AMBULATORY_CARE_PROVIDER_SITE_OTHER): Payer: Medicare Other | Admitting: Vascular Surgery

## 2011-09-13 ENCOUNTER — Encounter: Payer: Self-pay | Admitting: Vascular Surgery

## 2011-09-13 VITALS — BP 168/89 | HR 80 | Resp 18 | Ht 69.0 in | Wt 126.0 lb

## 2011-09-13 DIAGNOSIS — N186 End stage renal disease: Secondary | ICD-10-CM | POA: Insufficient documentation

## 2011-09-13 DIAGNOSIS — T82898A Other specified complication of vascular prosthetic devices, implants and grafts, initial encounter: Secondary | ICD-10-CM

## 2011-09-13 NOTE — Progress Notes (Signed)
Subjective:     Patient ID: Stanley Stewart, male   DOB: [birth year], 56 y.o.   MRN: [id number]  HPI this 56 year old male was referred for an aneurysm the left arm AV fistula. I created his radial-cephalic fistula in [id number] has worked nicely. I evaluated him in June of 2012 for aneurysmal dilatation of the fistula. He had dilated at that point to 2-1/2-3 cm in maximum diameter. He has had no history of bleeding skin breakdown or other complications. He denies pain in the hand numbness in the hand or pain in the fistula itself.  Past Medical History  Diagnosis Date  . ESRD (end stage renal disease)   . Pseudoaneurysm     Enlarged Left   FA    AVF  . Hypertension   . Anemia   . Arthritis   . Lupus 2002  . Peripheral neuropathy   . Herpes zoster   . 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 Vertigo   . Iritis     History  Substance Use Topics  . Smoking status: Never Smoker   . Smokeless tobacco: Never Used  . Alcohol Use: No    Family History  Problem Relation Age of Onset  . Hypertension Mother   . Heart disease Father   . Hypertension Father     Allergies  Allergen Reactions  . Neurontin (Gabapentin)     Current outpatient prescriptions:hydroxychloroquine (PLAQUENIL) 200 MG tablet, Take by mouth 2 (two) times daily., Disp: , Rfl: ;  losartan (COZAAR) 50 MG tablet, Take 50 mg by mouth daily., Disp: , Rfl: ;  predniSONE (DELTASONE) 5 MG tablet, Take 5 mg by mouth 2 (two) times daily., Disp: , Rfl: ;  enalapril (VASOTEC) 10 MG tablet, Take 10 mg by mouth daily., Disp: , Rfl:  triamterene-hydrochlorothiazide (DYAZIDE) 37.5-25 MG per capsule, Take 1 capsule by mouth every morning., Disp: , Rfl:   BP 168/89  Pulse 80  Resp 18  Ht 5\' 9"  (1.753 m)  Wt 126 lb (57.153 kg)  BMI 18.61 kg/m2  Body mass index is 18.61 kg/(m^2).           Review of Systems denies chest pain, dyspnea on exertion, PND, orthopnea, hemoptysis    Objective:   Physical Exam blood pressure  160/89 heart rate 80 respirations 18 General well-developed well-nourished male in no apparent stress alert and oriented x3 Lungs no rhonchi or wheezing Cardiovascular regular rhythm no murmurs Left upper extremity with functioning radial cephalic AV fistula. There is diffuse aneurysmal dilatation from the radial artery anastomosis up to the antecubital area with 2 prominent dilated areas the more distal measuring 2.9 cm in diameter and the more proximal measuring 2.7 cm in diameter. There is good perfusion of the left hand. There is no ulceration noted on the aneurysmally dilated segments.  Today I ordered a duplex scan of the left arm fistula which confirmed the above findings.     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 We can get rid of fistula but will not be able to salvage it. He will require ligation of the radial artery anastomosis and creation of a left upper arm fistula. Discussed this at length with the patient and I  have recommended he agrees that should try to get more time out of this fistula before moving to the upper arm

## 2011-09-13 NOTE — Progress Notes (Signed)
LEft avf duplex performed @ [facility] 09/13/2011

## 2012-06-20 ENCOUNTER — Telehealth: Payer: Self-pay | Admitting: *Deleted

## 2012-06-20 NOTE — Telephone Encounter (Signed)
Stanley Stewart left a message that Stanley Stewart fistula was bleeding post treatment. I was at lunch.When I returned and called her back she asked me to talk to Stanley Stewart. Stanley Stewart said it had an aneurysm and was bleeding since treatment; but he had gone home. I told her to send him to the ER because we were not equipped to suture it up at the office. She was going to talk with her PA Stanley Stewart.

## 2012-06-25 ENCOUNTER — Encounter: Payer: Self-pay | Admitting: Vascular Surgery

## 2012-06-26 ENCOUNTER — Ambulatory Visit: Payer: Medicare Other | Admitting: Vascular Surgery

## 2012-11-27 ENCOUNTER — Encounter: Payer: Self-pay | Admitting: Cardiovascular Disease

## 2012-11-27 ENCOUNTER — Ambulatory Visit (INDEPENDENT_AMBULATORY_CARE_PROVIDER_SITE_OTHER): Payer: Medicare Other | Admitting: Cardiovascular Disease

## 2012-11-27 ENCOUNTER — Encounter: Payer: Self-pay | Admitting: *Deleted

## 2012-11-27 VITALS — BP 142/72 | HR 80 | Ht 69.0 in | Wt 135.0 lb

## 2012-11-27 DIAGNOSIS — R079 Chest pain, unspecified: Secondary | ICD-10-CM

## 2012-11-27 NOTE — Patient Instructions (Addendum)
Your physician recommends that you schedule a follow-up appointment in:  Early January with Dr. [name]  Your physician has requested that you have an echocardiogram. Echocardiography is a painless test that uses sound waves to create images of your heart. It provides your doctor with information about the size and shape of your heart and how well your heart's chambers and valves are working. This procedure takes approximately one hour. There are no restrictions for this procedure.  Your physician recommends that you return for lab work  On December 17, 2012   Your physician has requested that you have a cardiac catheterization. Cardiac catheterization is used to diagnose and/or treat various heart conditions. Doctors may recommend this procedure for a number of different reasons. The most common reason is to evaluate chest pain. Chest pain can be a symptom of coronary artery disease (CAD), and cardiac catheterization can show whether plaque is narrowing or blocking your heart's arteries. This procedure is also used to evaluate the valves, as well as measure the blood flow and oxygen levels in different parts of your heart. For further information please visit [email or web address]. Please follow instruction sheet, as given. Scheduled for December 20, 2012   Coronary Angiography Coronary angiography is an X-ray procedure used to look at the arteries in the heart. In this procedure, a dye (contrast dye) is injected through a long, hollow tube (catheter). The catheter is about the size of a piece of cooked spaghetti and is inserted through your groin, wrist, or arm. The dye is injected into each artery, and X-rays are then taken to show if there is a blockage in the arteries of your heart. LET [facility] CARE PROVIDER KNOW ABOUT:  Any allergies you have, including allergies to shellfish or contrast dye.   All medicines you are taking, including vitamins, herbs, eye drops, creams, and over-the-counter  medicines.   Previous problems you or members of your family have had with the use of anesthetics.   Any blood disorders you have.   Previous surgeries you have had.  History of kidney problems or failure.   Other medical conditions you have. RISKS AND COMPLICATIONS  Generally, coronary angiography is a safe procedure. However, as with any procedure, complications can occur. Possible complications include:  Allergic reaction to the dye.  Bleeding from the access site or other locations.  Kidney injury, especially in people with impaired kidney function.  Stroke (rare).  Heart attack (rare). BEFORE THE PROCEDURE   Do not eat or drink anything after midnight the night before the procedure, or as directed by your health care provider.   Ask your health care provider if it is okay to take any needed medicines with a sip of water.  PROCEDURE  You may be given a medicine to help you relax (sedative) before the procedure. This medicine is given through an intravenous (IV) access tube that is inserted into one of your veins.   The area where the catheter will be inserted is washed and shaved. This is usually done in the groin but may be done in the fold of your arm (near your elbow) or in the wrist.   A medicine will be given to numb the area where the catheter will be inserted (local anesthetic).   The health care provider will insert the catheter into an artery. The catheter is guided by using a special type of X-ray (fluoroscopy) of the blood vessel being examined.   A special dye is then injected into the catheter, and  X-rays are taken. The dye helps to show where any narrowing or blockages are located in the heart arteries.  AFTER THE PROCEDURE   If the procedure is done through the leg, you will be kept in bed lying flat for several hours. You will be instructed to not bend or cross your legs.  The insertion site will be checked frequently.   The pulse in your  feet or wrist will be checked frequently.   Additional blood tests, X-rays, and an electrocardiogram may be done.   You may need to stay in the hospital overnight for observation.  Document Released: 06/26/2002 Document Revised: 08/22/2012 Document Reviewed: 05/14/2012 [facility] Patient Information 2014 [place].

## 2012-11-27 NOTE — Progress Notes (Signed)
History of Present Illness: 57 yo male with history of ESRD on HD, HTN, Lupus, vertigo, anemia,  here today as a new patient for evaluation of chest pain. He has been on HD for 4 years. He describes substernal chest pain for several months. This occurs at rest and with exertion. This is associated with dyspnea. No associated N/V, diaphoresis, dizziness.   Primary Care Physician: [facility] Nephrology: Dr. Cheryll Dessert   Past Medical History  Diagnosis Date  . ESRD (end stage renal disease)   . Pseudoaneurysm     Enlarged Left   FA    AVF  . Hypertension   . Anemia   . Arthritis   . Lupus 2002  . Peripheral neuropathy   . Herpes zoster   . 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 Vertigo   . Iritis     Past Surgical History  Procedure Laterality Date  . Av fistula placement  05/28/2006    LEFT forearm AVF  . Fracture surgery  Oct. 2009    Right Hip    Current Outpatient Prescriptions  Medication Sig Dispense Refill  . B Complex-C-Folic Acid (DIALYVITE Q000111Q PO) Take by mouth as directed.      . cinacalcet (SENSIPAR) 30 MG tablet Take 30 mg by mouth as directed.      . hydroxychloroquine (PLAQUENIL) 200 MG tablet Take by mouth 2 (two) times daily.      [name] losartan (COZAAR) 50 MG tablet Take 50 mg by mouth daily.      . predniSONE (DELTASONE) 5 MG tablet Take 5 mg by mouth 2 (two) times daily.       No current facility-administered medications for this visit.    Allergies  Allergen Reactions  . Amitriptyline   . Neurontin [Gabapentin]   . Penicillins     History   Social History  . Marital Status: Married    Spouse Name: N/A    Number of Children: N/A  . Years of Education: N/A   Occupational History  . Not on file.   Social History Main Topics  . Smoking status: Never Smoker   . Smokeless tobacco: Never Used  . Alcohol Use: No  . Drug Use: No  . Sexual Activity: Not on file   Other Topics Concern  . Not on file   Social History Narrative  .  No narrative on file    Family History  Problem Relation Age of Onset  . Hypertension Mother   . Heart disease Father   . Hypertension Father     Review of Systems:  As stated in the HPI and otherwise negative.   BP 142/72  Pulse 80  Ht 5\' 9"  (1.753 m)  Wt 135 lb (61.236 kg)  BMI 19.93 kg/m2  Physical Examination: General: Well developed, well nourished, NAD HEENT: OP clear, mucus membranes moist SKIN: warm, dry. No rashes. Neuro: No focal deficits Musculoskeletal: Muscle strength 5/5 all ext Psychiatric: Mood and affect normal Neck: No JVD, no carotid bruits, no thyromegaly, no lymphadenopathy. Lungs:Clear bilaterally, no wheezes, rhonci, crackles Cardiovascular: Regular rate and rhythm. No murmurs, gallops or rubs. Abdomen:Soft. Bowel sounds present. Non-tender.  Extremities: No lower extremity edema. Pulses are 2 + in the bilateral DP/PT.   EKG: NSR, rate 77 bpm. LVH. Inferior T wave inversion. Non-specific ST abnormality.   Assessment and Plan:   1. Chest pain: He has multiple risk factors for CAD. His symptoms are c/w unstable angina (class III). Will plan  cardiac cath with possible PCI on 12/20/12 which is the first day that fits his schedule. We have briefly discussed stress testing but he would like a definitive answer. Will arrange echo to assess LV size and function. Risks and benefits reviewed with pt. Pre-cath labs week of cath.

## 2012-12-05 ENCOUNTER — Encounter (HOSPITAL_COMMUNITY): Payer: Self-pay | Admitting: Pharmacy Technician

## 2012-12-11 ENCOUNTER — Ambulatory Visit (HOSPITAL_COMMUNITY): Payer: Medicare Other | Attending: Cardiovascular Disease | Admitting: Radiology

## 2012-12-11 DIAGNOSIS — N186 End stage renal disease: Secondary | ICD-10-CM | POA: Insufficient documentation

## 2012-12-11 DIAGNOSIS — R072 Precordial pain: Secondary | ICD-10-CM

## 2012-12-11 DIAGNOSIS — I059 Rheumatic mitral valve disease, unspecified: Secondary | ICD-10-CM | POA: Insufficient documentation

## 2012-12-11 DIAGNOSIS — R079 Chest pain, unspecified: Secondary | ICD-10-CM

## 2012-12-11 DIAGNOSIS — I359 Nonrheumatic aortic valve disorder, unspecified: Secondary | ICD-10-CM | POA: Insufficient documentation

## 2012-12-11 DIAGNOSIS — M329 Systemic lupus erythematosus, unspecified: Secondary | ICD-10-CM | POA: Insufficient documentation

## 2012-12-11 NOTE — Progress Notes (Signed)
Echocardiogram performed.  

## 2012-12-17 ENCOUNTER — Other Ambulatory Visit (INDEPENDENT_AMBULATORY_CARE_PROVIDER_SITE_OTHER): Payer: Medicare Other

## 2012-12-17 ENCOUNTER — Telehealth: Payer: Self-pay | Admitting: *Deleted

## 2012-12-17 DIAGNOSIS — R079 Chest pain, unspecified: Secondary | ICD-10-CM

## 2012-12-17 LAB — PROTIME-INR
INR: 1 ratio (ref 0.8–1.0)
Prothrombin Time: 10.4 s (ref 10.2–12.4)

## 2012-12-17 LAB — BASIC METABOLIC PANEL
BUN: 27 mg/dL — ABNORMAL HIGH (ref 6–23)
CO2: 33 mEq/L — ABNORMAL HIGH (ref 19–32)
Calcium: 9.7 mg/dL (ref 8.4–10.5)
Chloride: 96 mEq/L (ref 96–112)
Glucose, Bld: 101 mg/dL — ABNORMAL HIGH (ref 70–99)
Potassium: 5.2 mEq/L — ABNORMAL HIGH (ref 3.5–5.1)

## 2012-12-17 LAB — CBC WITH DIFFERENTIAL/PLATELET
Eosinophils Absolute: 0.1 10*3/uL (ref 0.0–0.7)
Eosinophils Relative: 1.1 % (ref 0.0–5.0)
HCT: 37 % — ABNORMAL LOW (ref 39.0–52.0)
Lymphocytes Relative: 18.1 % (ref 12.0–46.0)
Lymphs Abs: 1.1 10*3/uL (ref 0.7–4.0)
MCV: 79.3 fl (ref 78.0–100.0)
Monocytes Absolute: 0.7 10*3/uL (ref 0.1–1.0)
Neutrophils Relative %: 68.4 % (ref 43.0–77.0)
Platelets: 184 10*3/uL (ref 150.0–400.0)
WBC: 6.1 10*3/uL (ref 4.5–10.5)

## 2012-12-17 NOTE — Telephone Encounter (Signed)
Left message to call back  

## 2012-12-17 NOTE — Telephone Encounter (Signed)
PT IS SCHEDULED TO HAVE CATH DONE ON 12/20/12 AND IS HAVING A DENTAL PROCEDURE DONE 12/18/12 HE JUST WANTS TO MAKE SURE THIS WILL NOT EFFECT HIS CATH IN ANYWAY.

## 2012-12-17 NOTE — Telephone Encounter (Signed)
Lab results reviewed with Dr. Ron Parker who states as long as pt has dialysis scheduled prior to cath no changes need to be made at this time.  I spoke with pt who confirms he is scheduled for dialysis this Wednesday (Monday, Wednesday, Friday schedule).   Pt reports he is to have cavity filled tomorrow.  I told him this should not interfere with cath and OK to proceed with filling.

## 2012-12-19 ENCOUNTER — Telehealth: Payer: Self-pay | Admitting: Cardiovascular Disease

## 2012-12-19 MED ORDER — SODIUM CHLORIDE 0.9 % IJ SOLN: 3.0000 mL | INTRAMUSCULAR | Status: DC | PRN

## 2012-12-19 MED ORDER — ASPIRIN 81 MG PO CHEW
81.0000 mg | CHEWABLE_TABLET | ORAL | Status: AC
Start: 2012-12-20 — End: 2012-12-20
  Administered 2012-12-20: 81 mg via ORAL

## 2012-12-19 MED ORDER — DIAZEPAM 5 MG PO TABS
5.0000 mg | ORAL_TABLET | ORAL | Status: AC
  Administered 2012-12-20: 5 mg via ORAL

## 2012-12-19 MED ORDER — SODIUM CHLORIDE 0.9 % IJ SOLN: 3.0000 mL | Freq: Two times a day (BID) | INTRAMUSCULAR | Status: DC

## 2012-12-19 MED ORDER — SODIUM CHLORIDE 0.9 % IV SOLN: 250.0000 mL | INTRAVENOUS | Status: DC | PRN

## 2012-12-19 NOTE — Telephone Encounter (Signed)
Spoke with pt and reviewed cath instructions with him

## 2012-12-19 NOTE — Telephone Encounter (Signed)
New Message  Pt called CATH scheduled for 12/18 at 7:30am, requests a call back with additional information for procedure. Such as time of arrival, what not to eat, etc. Please call

## 2012-12-20 ENCOUNTER — Encounter (HOSPITAL_COMMUNITY): Payer: Self-pay | Admitting: Emergency Medicine

## 2012-12-20 ENCOUNTER — Ambulatory Visit (HOSPITAL_COMMUNITY)
Admission: RE | Admit: 2012-12-20 | Discharge: 2012-12-21 | Disposition: A | Discharge status: Home / Self Care | Payer: Medicare Other | Source: Ambulatory Visit | Attending: Cardiovascular Disease | Admitting: Cardiovascular Disease

## 2012-12-20 ENCOUNTER — Encounter (HOSPITAL_COMMUNITY)
Admission: RE | Disposition: A | Discharge status: Home / Self Care | Payer: Medicare Other | Source: Ambulatory Visit | Attending: Cardiovascular Disease

## 2012-12-20 ENCOUNTER — Other Ambulatory Visit: Payer: Self-pay

## 2012-12-20 DIAGNOSIS — Z992 Dependence on renal dialysis: Secondary | ICD-10-CM | POA: Insufficient documentation

## 2012-12-20 DIAGNOSIS — I200 Unstable angina: Secondary | ICD-10-CM | POA: Diagnosis present

## 2012-12-20 DIAGNOSIS — R079 Chest pain, unspecified: Secondary | ICD-10-CM

## 2012-12-20 DIAGNOSIS — I10 Essential (primary) hypertension: Secondary | ICD-10-CM | POA: Diagnosis present

## 2012-12-20 DIAGNOSIS — I120 Hypertensive chronic kidney disease with stage 5 chronic kidney disease or end stage renal disease: Secondary | ICD-10-CM | POA: Insufficient documentation

## 2012-12-20 DIAGNOSIS — R9431 Abnormal electrocardiogram [ECG] [EKG]: Secondary | ICD-10-CM | POA: Insufficient documentation

## 2012-12-20 DIAGNOSIS — M329 Systemic lupus erythematosus, unspecified: Secondary | ICD-10-CM | POA: Insufficient documentation

## 2012-12-20 DIAGNOSIS — I2510 Atherosclerotic heart disease of native coronary artery without angina pectoris: Secondary | ICD-10-CM | POA: Insufficient documentation

## 2012-12-20 DIAGNOSIS — N186 End stage renal disease: Secondary | ICD-10-CM | POA: Diagnosis present

## 2012-12-20 DIAGNOSIS — D649 Anemia, unspecified: Secondary | ICD-10-CM | POA: Insufficient documentation

## 2012-12-20 HISTORY — PX: LEFT HEART CATHETERIZATION WITH CORONARY ANGIOGRAM: SHX5451

## 2012-12-20 HISTORY — DX: Atherosclerotic heart disease of native coronary artery without angina pectoris: I25.10

## 2012-12-20 HISTORY — PX: CARDIAC CATHETERIZATION: SHX172

## 2012-12-20 HISTORY — PX: CORONARY ANGIOPLASTY: SHX604

## 2012-12-20 LAB — POCT ACTIVATED CLOTTING TIME: Activated Clotting Time: 459 seconds

## 2012-12-20 SURGERY — LEFT HEART CATHETERIZATION WITH CORONARY ANGIOGRAM
Anesthesia: LOCAL

## 2012-12-20 MED ORDER — OXYCODONE-ACETAMINOPHEN 5-325 MG PO TABS
1.0000 | ORAL_TABLET | ORAL | Status: DC | PRN
  Administered 2012-12-20 (×2): 1 via ORAL
  Filled 2012-12-20 (×2): qty 1

## 2012-12-20 MED ORDER — CLOPIDOGREL BISULFATE 300 MG PO TABS
ORAL_TABLET | ORAL | Status: AC
  Filled 2012-12-20: qty 2

## 2012-12-20 MED ORDER — MIDAZOLAM HCL 2 MG/2ML IJ SOLN
INTRAMUSCULAR | Status: AC
  Filled 2012-12-20: qty 2

## 2012-12-20 MED ORDER — DIAZEPAM 5 MG PO TABS
ORAL_TABLET | ORAL | Status: AC
  Filled 2012-12-20: qty 1

## 2012-12-20 MED ORDER — LIDOCAINE HCL (PF) 1 % IJ SOLN
INTRAMUSCULAR | Status: AC
  Filled 2012-12-20: qty 30

## 2012-12-20 MED ORDER — HEPARIN (PORCINE) IN NACL 2-0.9 UNIT/ML-% IJ SOLN
INTRAMUSCULAR | Status: AC
  Filled 2012-12-20: qty 1500

## 2012-12-20 MED ORDER — NITROGLYCERIN 0.2 MG/ML ON CALL CATH LAB
INTRAVENOUS | Status: AC
  Filled 2012-12-20: qty 1

## 2012-12-20 MED ORDER — ACETAMINOPHEN 325 MG PO TABS: 650.0000 mg | ORAL_TABLET | ORAL | Status: DC | PRN

## 2012-12-20 MED ORDER — ONDANSETRON HCL 4 MG/2ML IJ SOLN: 4.0000 mg | Freq: Four times a day (QID) | INTRAMUSCULAR | Status: DC | PRN

## 2012-12-20 MED ORDER — CINACALCET HCL 30 MG PO TABS
30.0000 mg | ORAL_TABLET | Freq: Every day | ORAL | Status: DC
  Administered 2012-12-20 – 2012-12-21 (×2): 30 mg via ORAL
  Filled 2012-12-20 (×2): qty 1

## 2012-12-20 MED ORDER — ATORVASTATIN CALCIUM 40 MG PO TABS
40.0000 mg | ORAL_TABLET | Freq: Every day | ORAL | Status: DC
  Administered 2012-12-20: 17:00:00 40 mg via ORAL
  Filled 2012-12-20 (×2): qty 1

## 2012-12-20 MED ORDER — ASPIRIN 81 MG PO CHEW
81.0000 mg | CHEWABLE_TABLET | Freq: Every day | ORAL | Status: DC
  Administered 2012-12-21: 81 mg via ORAL
  Filled 2012-12-20: qty 1

## 2012-12-20 MED ORDER — LOSARTAN POTASSIUM 50 MG PO TABS
50.0000 mg | ORAL_TABLET | Freq: Every day | ORAL | Status: DC
  Administered 2012-12-20 – 2012-12-21 (×2): 50 mg via ORAL
  Filled 2012-12-20 (×2): qty 1

## 2012-12-20 MED ORDER — PREDNISONE 5 MG PO TABS
5.0000 mg | ORAL_TABLET | Freq: Every day | ORAL | Status: DC
  Administered 2012-12-20 – 2012-12-21 (×2): 5 mg via ORAL
  Filled 2012-12-20 (×2): qty 1

## 2012-12-20 MED ORDER — BIVALIRUDIN 250 MG IV SOLR
INTRAVENOUS | Status: AC
  Filled 2012-12-20: qty 250

## 2012-12-20 MED ORDER — CLOPIDOGREL BISULFATE 75 MG PO TABS
75.0000 mg | ORAL_TABLET | Freq: Every day | ORAL | Status: DC
  Administered 2012-12-21: 75 mg via ORAL
  Filled 2012-12-20: qty 1

## 2012-12-20 MED ORDER — FENTANYL CITRATE 0.05 MG/ML IJ SOLN
INTRAMUSCULAR | Status: AC
  Filled 2012-12-20: qty 2

## 2012-12-20 MED ORDER — MORPHINE SULFATE 2 MG/ML IJ SOLN
2.0000 mg | INTRAMUSCULAR | Status: DC | PRN
  Administered 2012-12-20: 2 mg via INTRAVENOUS
  Filled 2012-12-20: qty 1

## 2012-12-20 MED ORDER — SODIUM CHLORIDE 0.9 % IJ SOLN
3.0000 mL | Freq: Two times a day (BID) | INTRAMUSCULAR | Status: DC
  Administered 2012-12-20 (×2): 3 mL via INTRAVENOUS

## 2012-12-20 MED ORDER — ASPIRIN 81 MG PO CHEW
CHEWABLE_TABLET | ORAL | Status: AC
  Filled 2012-12-20: qty 1

## 2012-12-20 MED ORDER — LIDOCAINE-EPINEPHRINE 1 %-1:100000 IJ SOLN
INTRAMUSCULAR | Status: AC
  Filled 2012-12-20: qty 1

## 2012-12-20 MED ORDER — HYDROXYCHLOROQUINE SULFATE 200 MG PO TABS
200.0000 mg | ORAL_TABLET | Freq: Every day | ORAL | Status: DC
  Administered 2012-12-20 – 2012-12-21 (×2): 200 mg via ORAL
  Filled 2012-12-20 (×2): qty 1

## 2012-12-20 MED ORDER — SODIUM CHLORIDE 0.9 % IV SOLN: INTRAVENOUS | Status: DC

## 2012-12-20 MED FILL — Sodium Chloride IV Soln 0.9%: INTRAVENOUS | Qty: 50 | Status: AC

## 2012-12-20 NOTE — Progress Notes (Signed)
Patient received from cath holding at 0930 with femstop applied to right groin.  Pressure reduced per protocol, femstop left in place until hemostasis achieved at 1545.  Femstop removed at that time and dressing applied.  Bed rest maintained until 1900.

## 2012-12-20 NOTE — CV Procedure (Signed)
Cardiac Catheterization Operative Report  Stanley Stewart [id number] 12/18/20148:42 AM [name], MD  Procedure Performed:  1. Left Heart Catheterization 2. Selective Coronary Angiography 3. Left ventricular angiogram 4. PTCA with balloon angioplasty only of the PDA 5. Angioseal femoral artery closure device  Operator: Lauree Chandler, MD  Indication:  57 yo male with history of ESRD on HD, HTN, Lupus, vertigo, anemia with recent chest pain c/w unstable angina. He has been on HD for 4 years. He describes substernal chest pain for several months. This occurs at rest and with exertion. This is associated with dyspnea. No associated N/V, diaphoresis, dizziness.                                    Procedure Details: The risks, benefits, complications, treatment options, and expected outcomes were discussed with the patient. The patient and/or family concurred with the proposed plan, giving informed consent. The patient was brought to the cath lab after IV hydration was begun and oral premedication was given. The patient was further sedated with Versed and Fentanyl. The right groin was prepped and draped in the usual manner. Using the modified Seldinger access technique, a 5 French sheath was placed in the right femoral artery. Standard diagnostic catheters were used to perform selective coronary angiography. A pigtail catheter was used to perform a left ventricular angiogram. He was found to have a severe stenosis in the small caliber PDA branch of the RCA. We elected to proceed to PCI as this was felt to be causing class III, unstable angina.   PCI Note: The sheath was upsized to a 6 [place] system. He was given 600 mg Plavix po x 1. He was given a weight based bolus of Angiomax and a drip was started. When the ACT was over 200, I engaged the RCA with a JR4 guiding catheter. I then advanced a Cougar IC wire down the RCA into the PDA branch. I then inflated a 2.0 x 15 mm balloon  for one long inflation. There was an excellent angiographic result. The stenosis was taken from 99% down to 0%. This vessel was 2.0 mm in diameter and was too small for a stent.   There were no immediate complications. The patient was taken to the recovery area in stable condition.   Hemodynamic Findings: Central aortic pressure: 148/82 Left ventricular pressure: 139/5/8  Angiographic Findings:  Left main: No obstructive disease  Left Anterior Descending Artery: Large caliber vessel that courses to the apex. The mid vessel has diffuse 20% stenosis. The diagonal branch is moderate in caliber and has mild plaque disease.   Circumflex Artery: Large caliber vessel with moderate caliber intermediate branch. The intermediate branch has mild plaque disease. The AV groove Circumflex terminates into a small obtuse marginal branch.   Right Coronary Artery: Large dominant vessel with heavy calcification noted in the proximal, mid and distal vessel. The mid and distal vessel has serial 30% stenoses. The PDA is a 1.75-2.0 mm vessel with proximal 99% stenosis.   Left Ventricular Angiogram: LVEF=55%. Subtle hypokinesis at apex.   Impression: 1. Severe single vessel CAD 2. Unstable angina (class III) secondary to severe stenosis PDA 3. Successful PTCA (balloon angioplasty only) of the PDA. Too small in caliber for a stent but likely culprit for angina. 4. Preserved LV systolic function  Recommendations: He will be continued on ASA and Plavix. Will start statin. Dialysis here tomorrow am  then d/c home after dialysis if stable.        Complications:  None. The patient tolerated the procedure well.

## 2012-12-20 NOTE — H&P (View-Only) (Signed)
History of Present Illness: 57 yo male with history of ESRD on HD, HTN, Lupus, vertigo, anemia,  here today as a new patient for evaluation of chest pain. He has been on HD for 4 years. He describes substernal chest pain for several months. This occurs at rest and with exertion. This is associated with dyspnea. No associated N/V, diaphoresis, dizziness.   Primary Care Physician: [facility] Nephrology: Dr. Cheryll Dessert   Past Medical History  Diagnosis Date  . ESRD (end stage renal disease)   . Pseudoaneurysm     Enlarged Left   FA    AVF  . Hypertension   . Anemia   . Arthritis   . Lupus 2002  . Peripheral neuropathy   . Herpes zoster   . 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 Vertigo   . Iritis     Past Surgical History  Procedure Laterality Date  . Av fistula placement  05/28/2006    LEFT forearm AVF  . Fracture surgery  Oct. 2009    Right Hip    Current Outpatient Prescriptions  Medication Sig Dispense Refill  . B Complex-C-Folic Acid (DIALYVITE Q000111Q PO) Take by mouth as directed.      . cinacalcet (SENSIPAR) 30 MG tablet Take 30 mg by mouth as directed.      . hydroxychloroquine (PLAQUENIL) 200 MG tablet Take by mouth 2 (two) times daily.      [name] losartan (COZAAR) 50 MG tablet Take 50 mg by mouth daily.      . predniSONE (DELTASONE) 5 MG tablet Take 5 mg by mouth 2 (two) times daily.       No current facility-administered medications for this visit.    Allergies  Allergen Reactions  . Amitriptyline   . Neurontin [Gabapentin]   . Penicillins     History   Social History  . Marital Status: Married    Spouse Name: N/A    Number of Children: N/A  . Years of Education: N/A   Occupational History  . Not on file.   Social History Main Topics  . Smoking status: Never Smoker   . Smokeless tobacco: Never Used  . Alcohol Use: No  . Drug Use: No  . Sexual Activity: Not on file   Other Topics Concern  . Not on file   Social History Narrative  .  No narrative on file    Family History  Problem Relation Age of Onset  . Hypertension Mother   . Heart disease Father   . Hypertension Father     Review of Systems:  As stated in the HPI and otherwise negative.   BP 142/72  Pulse 80  Ht 5\' 9"  (1.753 m)  Wt 135 lb (61.236 kg)  BMI 19.93 kg/m2  Physical Examination: General: Well developed, well nourished, NAD HEENT: OP clear, mucus membranes moist SKIN: warm, dry. No rashes. Neuro: No focal deficits Musculoskeletal: Muscle strength 5/5 all ext Psychiatric: Mood and affect normal Neck: No JVD, no carotid bruits, no thyromegaly, no lymphadenopathy. Lungs:Clear bilaterally, no wheezes, rhonci, crackles Cardiovascular: Regular rate and rhythm. No murmurs, gallops or rubs. Abdomen:Soft. Bowel sounds present. Non-tender.  Extremities: No lower extremity edema. Pulses are 2 + in the bilateral DP/PT.   EKG: NSR, rate 77 bpm. LVH. Inferior T wave inversion. Non-specific ST abnormality.   Assessment and Plan:   1. Chest pain: He has multiple risk factors for CAD. His symptoms are c/w unstable angina (class III). Will plan  cardiac cath with possible PCI on 12/20/12 which is the first day that fits his schedule. We have briefly discussed stress testing but he would like a definitive answer. Will arrange echo to assess LV size and function. Risks and benefits reviewed with pt. Pre-cath labs week of cath.

## 2012-12-20 NOTE — Interval H&P Note (Signed)
History and Physical Interval Note:  12/20/2012 7:55 AM  Stanley Stewart  has presented today for cardiac cath with the diagnosis of cp  The various methods of treatment have been discussed with the patient and family. After consideration of risks, benefits and other options for treatment, the patient has consented to  Procedure(s): LEFT HEART CATHETERIZATION WITH CORONARY ANGIOGRAM (N/A) as a surgical intervention .  The patient's history has been reviewed, patient examined, no change in status, stable for surgery.  I have reviewed the patient's chart and labs.  Questions were answered to the patient's satisfaction.    Cath Lab Visit (complete for each Cath Lab visit)  Clinical Evaluation Leading to the Procedure:   ACS: no  Non-ACS:    Anginal Classification: CCS III  Anti-ischemic medical therapy: Minimal Therapy (1 class of medications)  Non-Invasive Test Results: No non-invasive testing performed  Prior CABG: No previous CABG         MCALHANY,Nasiir

## 2012-12-21 ENCOUNTER — Encounter (HOSPITAL_COMMUNITY): Payer: Self-pay | Admitting: Nurse Practitioner

## 2012-12-21 DIAGNOSIS — M329 Systemic lupus erythematosus, unspecified: Secondary | ICD-10-CM | POA: Diagnosis present

## 2012-12-21 DIAGNOSIS — I2510 Atherosclerotic heart disease of native coronary artery without angina pectoris: Secondary | ICD-10-CM

## 2012-12-21 DIAGNOSIS — I200 Unstable angina: Secondary | ICD-10-CM

## 2012-12-21 DIAGNOSIS — D649 Anemia, unspecified: Secondary | ICD-10-CM | POA: Insufficient documentation

## 2012-12-21 DIAGNOSIS — I10 Essential (primary) hypertension: Secondary | ICD-10-CM | POA: Diagnosis present

## 2012-12-21 DIAGNOSIS — IMO0002 Reserved for concepts with insufficient information to code with codable children: Secondary | ICD-10-CM | POA: Diagnosis present

## 2012-12-21 LAB — CBC
HCT: 30.8 % — ABNORMAL LOW (ref 39.0–52.0)
MCHC: 32.1 g/dL (ref 30.0–36.0)
Platelets: 122 10*3/uL — ABNORMAL LOW (ref 150–400)
RDW: 15.9 % — ABNORMAL HIGH (ref 11.5–15.5)

## 2012-12-21 LAB — BASIC METABOLIC PANEL
BUN: 50 mg/dL — ABNORMAL HIGH (ref 6–23)
Creatinine, Ser: 10.41 mg/dL — ABNORMAL HIGH (ref 0.50–1.35)
GFR calc Af Amer: 6 mL/min — ABNORMAL LOW (ref >90)
GFR calc non Af Amer: 5 mL/min — ABNORMAL LOW (ref >90)
Potassium: 5.4 mEq/L — ABNORMAL HIGH (ref 3.5–5.1)

## 2012-12-21 MED ORDER — LIDOCAINE HCL (PF) 1 % IJ SOLN: 5.0000 mL | INTRAMUSCULAR | Status: DC | PRN

## 2012-12-21 MED ORDER — ASPIRIN 81 MG PO CHEW: 81.0000 mg | CHEWABLE_TABLET | Freq: Every day | ORAL | Status: DC

## 2012-12-21 MED ORDER — PENTAFLUOROPROP-TETRAFLUOROETH EX AERO: 1.0000 "application " | INHALATION_SPRAY | CUTANEOUS | Status: DC | PRN

## 2012-12-21 MED ORDER — SODIUM CHLORIDE 0.9 % IV SOLN: 100.0000 mL | INTRAVENOUS | Status: DC | PRN

## 2012-12-21 MED ORDER — ALTEPLASE 2 MG IJ SOLR
2.0000 mg | Freq: Once | INTRAMUSCULAR | Status: DC | PRN
  Filled 2012-12-21: qty 2

## 2012-12-21 MED ORDER — CLOPIDOGREL BISULFATE 75 MG PO TABS: 75.0000 mg | ORAL_TABLET | Freq: Every day | ORAL | Status: DC

## 2012-12-21 MED ORDER — ATORVASTATIN CALCIUM 40 MG PO TABS: 40.0000 mg | ORAL_TABLET | Freq: Every day | ORAL | Status: DC

## 2012-12-21 MED ORDER — HEPARIN SODIUM (PORCINE) 1000 UNIT/ML DIALYSIS: 1000.0000 [IU] | INTRAMUSCULAR | Status: DC | PRN

## 2012-12-21 MED ORDER — NEPRO/CARBSTEADY PO LIQD
237.0000 mL | ORAL | Status: DC | PRN
  Filled 2012-12-21: qty 237

## 2012-12-21 MED ORDER — LIDOCAINE-PRILOCAINE 2.5-2.5 % EX CREA
1.0000 "application " | TOPICAL_CREAM | CUTANEOUS | Status: DC | PRN
  Filled 2012-12-21: qty 5

## 2012-12-21 NOTE — Discharge Summary (Signed)
Discharge Summary   Patient ID: IZAAH SALT,  MRN: [id number], DOB/AGE: 09/24/1955 57 y.o.  Admit date: 12/20/2012 Discharge date: 12/21/2012  Primary Care Provider: [facility] Primary Cardiologist: C. [name], MD   Discharge Diagnoses Principal Problem:   Unstable angina Active Problems:   End stage renal disease   CAD (coronary artery disease)   Lupus   Hypertension   Allergies Allergies  Allergen Reactions  . Neurontin [Gabapentin] Other (See Comments)    Unknown   . Penicillins Other (See Comments)    Patient states that MD told him he was allergic to this med but he does not recall having any reactions   . Amitriptyline Rash   Procedures  Cardiac Catheterization and Percutaneous Coronary Intervention 12.18.2014  Hemodynamic Findings: Central aortic pressure: 148/82 Left ventricular pressure: 139/5/8  Angiographic Findings:  Left main: No obstructive disease Left Anterior Descending Artery: Large caliber vessel that courses to the apex. The mid vessel has diffuse 20% stenosis. The diagonal branch is moderate in caliber and has mild plaque disease.   Circumflex Artery: Large caliber vessel with moderate caliber intermediate branch. The intermediate branch has mild plaque disease. The AV groove Circumflex terminates into a small obtuse marginal branch.   Right Coronary Artery: Large dominant vessel with heavy calcification noted in the proximal, mid and distal vessel. The mid and distal vessel has serial 30% stenoses. The PDA is a 1.75-2.0 mm vessel with proximal 99% stenosis.              **Successful PTCA of the PDA was performed.**  Left Ventricular Angiogram: LVEF=55%. Subtle hypokinesis at apex.   Impression: 1. Severe single vessel CAD 2. Unstable angina (class III) secondary to severe stenosis PDA 3. Successful PTCA (balloon angioplasty only) of the PDA. Too small in caliber for a stent but likely culprit for angina. 4. Preserved LV systolic  function        History of Present Illness  57 y/o male with a prior h/o hypertension, lupus, vertigo, anemia, and end-stage renal disease on Monday, Wednesday, Friday dialysis (x 4 years).  He was recently seen in cardiology clinic secondary to progressive rest and exertional substernal chest discomfort and decision was made to pursue diagnostic catheterization.  Hospital Course  Patient presented to the [facility] cardiac catheterization laboratory on 12/20/2012, where he underwent diagnostic catheterization revealing a 99% proximal stenosis in the small (1.75 - 2.00 mm vessel) right Posterior Descending Artery (PDA) and otherwise non-obstructive coronary artery disease.  Left ventricular function was normal.  The PDA was felt to be the culprit for his symptoms and as such, it was intervened upon with balloon angioplasty only.  Luminal diameter was felt to be too small for stenting.  Post-procedure, Mr. Subasic has been ambulating without recurrent chest discomfort, dyspnea, or limitations.  Nephrology was contacted and he has undergone dialysis prior to discharge this morning.  Discharge Vitals Blood pressure 128/64, pulse 88, temperature 98.7 F (37.1 C), temperature source Oral, resp. rate 18, height 5\' 9"  (1.753 m), weight 135 lb (61.236 kg), SpO2 100.00%.  Filed Weights   12/20/12 0548  Weight: 135 lb (61.236 kg)   Labs  CBC  Recent Labs  12/21/12 0545  WBC 6.1  HGB 9.9*  HCT 30.8*  MCV 77.8*  PLT 123XX123*   Basic Metabolic Panel  Recent Labs  12/21/12 0545  NA 132*  K 5.4*  CL 94*  CO2 28  GLUCOSE 86  BUN 50*  CREATININE 10.41*  CALCIUM 9.2  Disposition  Pt is being discharged home today in good condition.  Follow-up Plans & Appointments  Follow-up Information   Follow up with Lauree Chandler, MD On 01/15/2013. (3:15 PM)    Specialty:  Cardiology   Contact information:   [place] [place] [address] [phone]       Follow up  with [name], MD. (as scheduled.)    Specialty:  Internal Medicine   Contact information:   [address] [place] [address] [phone]       Follow up with Estanislado Emms, MD. (dialysis as scheduled (MWF))    Specialty:  Nephrology   Contact information:   [place]  [id number] [phone]      Discharge Medications    Medication List         aspirin 81 MG chewable tablet  Chew 1 tablet (81 mg total) by mouth daily.     atorvastatin 40 MG tablet  Commonly known as:  LIPITOR  Take 1 tablet (40 mg total) by mouth daily at 6 PM.     cinacalcet 30 MG tablet  Commonly known as:  SENSIPAR  Take 30 mg by mouth daily.     clopidogrel 75 MG tablet  Commonly known as:  PLAVIX  Take 1 tablet (75 mg total) by mouth daily with breakfast.     DIALYVITE 800 PO  Take 1 tablet by mouth daily.     hydroxychloroquine 200 MG tablet  Commonly known as:  PLAQUENIL  Take 200 mg by mouth daily.     losartan 50 MG tablet  Commonly known as:  COZAAR  Take 50 mg by mouth daily.     predniSONE 5 MG tablet  Commonly known as:  DELTASONE  Take 5 mg by mouth daily.       Outstanding Labs/Studies  Follow-up lipids/lft's in 6-8 wks.  Duration of Discharge Encounter   Greater than 30 minutes including physician time.  Signed, Murray Hodgkins NP 12/21/2012, 7:15 AM

## 2012-12-21 NOTE — Progress Notes (Signed)
CARDIAC REHAB PHASE I   PRE:  Rate/Rhythm: 80 SR    BP: sitting 161/98    SaO2:   MODE:  Ambulation: 250 ft   POST:  Rate/Rhythm: 98 SR    BP: sitting 192/108, 171/91 after 15 min     SaO2:   Pt weak but determined. Unsteady at first, some better with distance. This is apparently pts baseline. Denied CP or SOB, feels much better. BP very elevated with walking. Decreased some with rest. Ed completed. Interested in [place] but will have to check on copay. Will send referral to [place], [place], ACSM 12/21/2012 8:07 AM

## 2012-12-21 NOTE — Progress Notes (Signed)
Patient Name: Stanley Stewart Date of Encounter: 12/21/2012   Principal Problem:   Unstable angina Active Problems:   End stage renal disease   CAD (coronary artery disease)   Lupus   Hypertension   SUBJECTIVE  No chest pain or sob.  No groin complaints.  For dialysis this AM.  CURRENT MEDS . aspirin  81 mg Oral Daily  . atorvastatin  40 mg Oral q1800  . cinacalcet  30 mg Oral Daily  . clopidogrel  75 mg Oral Q breakfast  . hydroxychloroquine  200 mg Oral Daily  . losartan  50 mg Oral Daily  . predniSONE  5 mg Oral Daily  . sodium chloride  3 mL Intravenous Q12H    OBJECTIVE  Filed Vitals:   12/20/12 1800 12/20/12 1900 12/20/12 2300 12/21/12 0500  BP: 181/101 141/95 141/95 128/64  Pulse: 88 83 88 88  Temp:  98.1 F (36.7 C) 98 F (36.7 C) 98.7 F (37.1 C)  TempSrc:  Oral Oral Oral  Resp:      Height:      Weight:      SpO2: 100% 99% 99% 100%    Intake/Output Summary (Last 24 hours) at 12/21/12 0707 Last data filed at 12/20/12 2200  Gross per 24 hour  Intake    660 ml  Output      0 ml  Net    660 ml   Filed Weights   12/20/12 0548  Weight: 135 lb (61.236 kg)    PHYSICAL EXAM  General: Pleasant, NAD. Neuro: Alert and oriented X 3. Moves all extremities spontaneously. Psych: Normal affect. HEENT:  Normal  Neck: Supple without bruits or JVD. Lungs:  Resp regular and unlabored, CTA. Heart: RRR no s3, s4, 2/6 systolic murmur heard throughout - radiates up to left neck. Abdomen: Soft, non-tender, non-distended, BS + x 4.  Extremities: No clubbing, cyanosis or edema.  Large left forearm AVF w/ bruit and thrill.  DP/PT/Radials 2+ and equal bilaterally.  R groin w/o bleeding/bruit/hematoma.  Accessory Clinical Findings  CBC  Recent Labs  12/21/12 0545  WBC 6.1  HGB 9.9*  HCT 30.8*  MCV 77.8*  PLT 123XX123*   Basic Metabolic Panel  Recent Labs  12/21/12 0545  NA 132*  K 5.4*  CL 94*  CO2 28  GLUCOSE 86  BUN 50*  CREATININE 10.41*    CALCIUM 9.2   TELE  rsr  ECG  Rsr, 72, lvh, lae, early repol - no acute st/t changes.  Radiology/Studies  No results found.  ASSESSMENT AND PLAN  1.  USA/CAD:  S/p PTCA of small RPDA branch yesterday.  No chest pain or dyspnea overnight.  Groin stable.  Cont asa, plavix, statin.  Plan d/c today after ambulation and dialysis.    2.  ESRD:  Will contact nephrology to arrange dialysis prior to d/c.  3.  HTN:  BP variable.  Cont current meds.  Re-eval post-dialysis.  4.  HL:  Cont statin.  5.  Microcytic/hypchromic anemia:  H/H down since 12/15.  No evidence of active bleeding.  Groin looks good.  6.  Hyperkalemia:  For dialysis today.  7.  Lupus:  On prednisone/plaquenil.  Signed, Murray Hodgkins NP  Patient seen, examined. Available data reviewed. Agree with findings, assessment, and plan as outlined by Ignacia Bayley, NP. Pt is stable post-PCI. He had no chest pain with walking down the hall this am. Stressed the importance of DAPT with ASA and plavix and he understands.  Sherren Mocha, M.D. 12/21/2012 8:44 AM

## 2012-12-21 NOTE — Consult Note (Signed)
Renal Service Consult Note [place] 12/21/2012 Roney Jaffe D Requesting Physician:  Dr [name]  Reason for Consult:  ESRD patient s/p elective coronary angioplasty HPI: The patient is a 57 y.o. year-old with hx of HTN, lupus, ESRD and CAD. Patient had new onset of chest pain worse with exertion last month.  He was scheduled for cardiac cath as he wanted to bypass the stress test.  Cath yesterday showed severe PAD disease which was treated with PTCA; the vessel was too small for a stent.  LV fxn good. Stable today, needs HD.   Patient started dialysis around 2010.  Pt states the cause of ESRD was lupus and/or HBP.  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 Gets hemodialysis at [facility]) on MWF schedule using L arm AVF.  He has not had any recent problems with HD.     ROS  no sob or cp  no n/v/d  no f/c/s  Past Medical History  Past Medical History  Diagnosis Date  . ESRD (end stage renal disease)     on HD since 2010  . Pseudoaneurysm     Enlarged Left   FA    AVF  . Hypertension   . Anemia   . Arthritis   . Lupus 2002  . Peripheral neuropathy   . Herpes zoster   . 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 Vertigo   . Iritis   . Coronary artery disease     a. 12/2012 Cath/PTCA: LM nl, LAD 16m, LCX nl, RCA 30d, RPDA 99 (PTCA only - 1.5-41mm vessel), EF 55%, subtle apical HK.   Past Surgical History  Past Surgical History  Procedure Laterality Date  . Av fistula placement  05/28/2006    LEFT forearm AVF  . Fracture surgery  Oct. 2009    Right Hip  . Lymph node biopsy right arm    . Cardiac catheterization  12/20/2012  . Coronary angioplasty  12/20/2012   Family History  Family History  Problem Relation Age of Onset  . Hypertension Mother   . CAD Father   . Hypertension Father    Social History  reports that he has never smoked. He has never used  smokeless tobacco. He reports that he does not drink alcohol or use illicit drugs. Allergies  Allergies  Allergen Reactions  . Neurontin [Gabapentin] Other (See Comments)    Unknown   . Penicillins Other (See Comments)    Patient states that MD told him he was allergic to this med but he does not recall having any reactions   . Amitriptyline Rash   Home medications Prior to Admission medications   Medication Sig Start Date End Date Taking? Authorizing Provider  B Complex-C-Folic Acid (DIALYVITE Q000111Q PO) Take 1 tablet by mouth daily.    Yes Historical Provider, MD  cinacalcet (SENSIPAR) 30 MG tablet Take 30 mg by mouth daily.    Yes Historical Provider, MD  hydroxychloroquine (PLAQUENIL) 200 MG tablet Take 200 mg by mouth daily.    Yes Historical Provider, MD  losartan (COZAAR) 50 MG tablet Take 50 mg by mouth daily.   Yes Historical Provider, MD  predniSONE (DELTASONE) 5 MG tablet Take 5 mg by mouth daily.    Yes Historical Provider, MD  aspirin 81 MG chewable tablet Chew 1 tablet (81 mg total) by mouth daily. 12/21/12   Rogelia Mire, NP  atorvastatin (LIPITOR) 40 MG tablet Take 1 tablet (40 mg total) by mouth daily at 6 PM. 12/21/12   Rogelia Mire, NP  clopidogrel (PLAVIX) 75 MG tablet Take 1 tablet (75 mg total) by mouth daily with breakfast. 12/21/12   Rogelia Mire, NP   Liver Function Tests No results found for this basename: AST, ALT, ALKPHOS, BILITOT, PROT, ALBUMIN,  in the last 168 hours No results found for this basename: LIPASE, AMYLASE,  in the last 168 hours CBC  Recent Labs Lab 12/17/12 1534 12/21/12 0545  WBC 6.1 6.1  NEUTROABS 4.1  --   HGB 11.7* 9.9*  HCT 37.0* 30.8*  MCV 79.3 77.8*  PLT 184.0 123XX123*   Basic Metabolic Panel  Recent Labs Lab 12/17/12 1534 12/21/12 0545  NA 141 132*  K 5.2* 5.4*  CL 96 94*  CO2 33* 28  GLUCOSE 101* 86  BUN 27* 50*  CREATININE 6.9* 10.41*  CALCIUM 9.7 9.2    Exam  Blood pressure 170/89, pulse 77,  temperature 98.7 F (37.1 C), temperature source Oral, resp. rate 18, height 5\' 9"  (1.753 m), weight 61.236 kg (135 lb), SpO2 100.00%.  gen: alert  skin: no rash, cyanosis  heent: eomi, sclera anicteric, throat clear  neck: no jvd, no bruit  chest: clear bilat  cor: regular, faint SEM 1/6 rusb  abd: soft, nt, nd, no ascites  ext: no LE or UE edema, disfigured fingers bilat (sequale of lupus arthritis)  neuro: alert, ox3, nonfocal  access: L FA AVF patent   Dialysis: MWF [place] 3:15hrs   400/1.5   2K/2.25 bath   60kg   Prof 4   L AVF   Heparin 1000 x 2 prn Hectorol 4     Epo 3800     Venofer 50/wk Last labs: Hb 11.2 tsat 26% ferritin 1189   <> calcium 10.1  phos 4.6  pth 203  Assessment/Plan: 1. Unstable angina / PTCA to LAD 12/18: stable  2. ESRD: plan HD this afternoon 3. Anemia / CKD: will f/u Hb at center 4. HTN/volume: 1.2 kg up , UF same with HD today 5. Dispo: ok for d/c from renal standpoint after HD   Kelly Splinter MD  pager [phone]    cell [phone]  12/21/2012, 9:20 AM   Kelly Splinter MD (pgr) [phone]    ([phone] 12/21/2012, 9:20 AM

## 2012-12-21 NOTE — Procedures (Signed)
I was present at this dialysis session, have reviewed the session itself and made  appropriate changes  Kelly Splinter MD (pgr) [phone]    ([phone] 12/21/2012, 2:38 PM

## 2013-01-15 ENCOUNTER — Encounter: Payer: Self-pay | Admitting: Cardiovascular Disease

## 2013-01-15 ENCOUNTER — Ambulatory Visit (INDEPENDENT_AMBULATORY_CARE_PROVIDER_SITE_OTHER): Payer: Medicare Other | Admitting: Cardiovascular Disease

## 2013-01-15 VITALS — BP 130/80 | HR 76 | Ht 69.0 in | Wt 134.0 lb

## 2013-01-15 DIAGNOSIS — I2510 Atherosclerotic heart disease of native coronary artery without angina pectoris: Secondary | ICD-10-CM

## 2013-01-15 NOTE — Progress Notes (Signed)
History of Present Illness: 58 yo male with history of ESRD on HD, HTN, Lupus, vertigo, anemia,  here today for cardiac follow up. I saw him 11/27/12 as a new patient for evaluation of chest pain. He has been on HD for 4 years. He described substernal chest pain for several months. This occurs at rest and with exertion. This is associated with dyspnea. No associated N/V, diaphoresis, dizziness. I arranged a cardiac cath on 12/20/12 and found to have 20% diffuse LAD stenosis, heavily calcified proximal, mid and distal RCA with severe stenosis small caliber PDA treated with balloon angioplasty only. The vessel was felt to be too small for a stent (1.75-2.0 mm vessel). Echo 122/09/14 overall normal (see below).   He is here today for follow up. He is doing well. No chest pain or SOB since his cardiac cath. Tolerating dialysis well.   Primary Care Physician: [facility] Nephrology: Dr. Cheryll Dessert   Past Medical History  Diagnosis Date  . ESRD (end stage renal disease)     Patient started dialysis around 2010.  Pt states the cause of ESRD was lupus and/or HBP.  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 Gets hemodialysis at [facility]) on MWF schedule using L arm AVF.    [name] Pseudoaneurysm     Enlarged Left   FA    AVF  . Hypertension   . Anemia   . Arthritis   . Lupus 2002  . Peripheral neuropathy   . Herpes zoster   . 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 Vertigo   . Iritis   . Coronary artery disease     a. 12/2012 Cath/PTCA: LM nl, LAD 68m, LCX nl, RCA 30d, RPDA 99 (PTCA only - 1.5-73mm vessel), EF 55%, subtle apical HK.    Past Surgical History  Procedure Laterality Date  . Av fistula placement  05/28/2006    LEFT forearm AVF  . Fracture surgery  Oct. 2009    Right Hip  . Lymph node biopsy right arm    . Cardiac catheterization  12/20/2012  . Coronary angioplasty  12/20/2012    Current Outpatient  Prescriptions  Medication Sig Dispense Refill  . aspirin 81 MG chewable tablet Chew 1 tablet (81 mg total) by mouth daily.      [name] atorvastatin (LIPITOR) 40 MG tablet Take 1 tablet (40 mg total) by mouth daily at 6 PM.  30 tablet  6  . B Complex-C-Folic Acid (DIALYVITE Q000111Q PO) Take 1 tablet by mouth daily.       . cinacalcet (SENSIPAR) 30 MG tablet Take 30 mg by mouth daily.       . clopidogrel (PLAVIX) 75 MG tablet Take 1 tablet (75 mg total) by mouth daily with breakfast.  30 tablet  6  . hydroxychloroquine (PLAQUENIL) 200 MG tablet Take 200 mg by mouth daily.       [name] losartan (COZAAR) 50 MG tablet Take 50 mg by mouth daily.      . predniSONE (DELTASONE) 5 MG tablet Take 5 mg by mouth daily.        No current facility-administered medications for this visit.    Allergies  Allergen Reactions  . Neurontin [Gabapentin] Other (See Comments)    Unknown   . Penicillins Other (See Comments)    Patient states that MD told him he was allergic to this med but he does not recall  having any reactions   . Amitriptyline Rash    History   Social History  . Marital Status: Married    Spouse Name: N/A    Number of Children: 2  . Years of Education: N/A   Occupational History  . Disability    Social History Main Topics  . Smoking status: Never Smoker   . Smokeless tobacco: Never Used  . Alcohol Use: No  . Drug Use: No  . Sexual Activity: Not on file   Other Topics Concern  . Not on file   Social History Narrative  . No narrative on file    Family History  Problem Relation Age of Onset  . Hypertension Mother   . CAD Father   . Hypertension Father     Review of Systems:  As stated in the HPI and otherwise negative.   BP 130/80  Pulse 76  Ht 5\' 9"  (1.753 m)  Wt 134 lb (60.782 kg)  BMI 19.78 kg/m2  Physical Examination: General: Well developed, well nourished, NAD HEENT: OP clear, mucus membranes moist SKIN: warm, dry. No rashes. Neuro: No focal  deficits Musculoskeletal: Muscle strength 5/5 all ext Psychiatric: Mood and affect normal Neck: No JVD, no carotid bruits, no thyromegaly, no lymphadenopathy. Lungs:Clear bilaterally, no wheezes, rhonci, crackles Cardiovascular: Regular rate and rhythm. No murmurs, gallops or rubs. Abdomen:Soft. Bowel sounds present. Non-tender.  Extremities: No lower extremity edema. Pulses are 2 + in the bilateral DP/PT.  Cardiac cath 12/20/12: Left main: No obstructive disease  Left Anterior Descending Artery: Large caliber vessel that courses to the apex. The mid vessel has diffuse 20% stenosis. The diagonal branch is moderate in caliber and has mild plaque disease.  Circumflex Artery: Large caliber vessel with moderate caliber intermediate branch. The intermediate branch has mild plaque disease. The AV groove Circumflex terminates into a small obtuse marginal branch.  Right Coronary Artery: Large dominant vessel with heavy calcification noted in the proximal, mid and distal vessel. The mid and distal vessel has serial 30% stenoses. The PDA is a 1.75-2.0 mm vessel with proximal 99% stenosis.  Left Ventricular Angiogram: LVEF=55%. Subtle hypokinesis at apex.  Impression:  1. Severe single vessel CAD  2. Unstable angina (class III) secondary to severe stenosis PDA  3. Successful PTCA (balloon angioplasty only) of the PDA. Too small in caliber for a stent but likely culprit for angina.  4. Preserved LV systolic function  Echo [id number]: Left ventricle: The cavity size was normal. Wall thickness was increased in a pattern of mild LVH with moderate basal septal hypertrophy. Systolic function was normal. The estimated ejection fraction was in the range of 55% to 60%. Wall motion was normal; there were no regional wall motion abnormalities. Doppler parameters are consistent with abnormal left ventricular relaxation (grade 1 diastolic dysfunction). Doppler parameters are consistent with high ventricular  filling pressure. - Aortic valve: Mildly calcified annulus. Trileaflet; mildly thickened leaflets. Trivial regurgitation. - Mitral valve: Calcified annulus. Trivial regurgitation. - Left atrium: The atrium was mildly dilated. - Right atrium: Central venous pressure: 88mm Hg (est). - Atrial septum: No defect or patent foramen ovale was identified. - Tricuspid valve: Trivial regurgitation. - Pulmonary arteries: Systolic pressure could not be accurately estimated. - Pericardium, extracardiac: There was no pericardial effusion. Impressions:  - No prior study for comparison. Mild LVH with moderate basal septal hypertrophy and LVEF 55-60%. Grade 1 diastolic dysfunction with increased filling pressures. Mild left atrial enlargement. MAC with trivial mitral regurgitation. Mildly sclerotic aortic valve with trivial  aortic regurgitation. Unable to assess PASP. CVP estimated in normal range. No pericardial effusion.  Assessment and Plan:   1. CAD: Stable post PCI of the small caliber PDA. Mild disease in the LAD and RCA. Will continue ASA, Plavix and statin. BP is well controlled. Will check lipids and LFTs in 8 weeks. Follow up 6 months.

## 2013-01-15 NOTE — Patient Instructions (Signed)
Your physician wants you to follow-up in:  6 months. You will receive a reminder letter in the mail two months in advance. If you don't receive a letter, please call our office to schedule the follow-up appointment.  Your physician recommends that you return for fasting  lab work in: first week of March.  The lab opens at 7:30 AM every week day.  (Lipid and Liver profiles)

## 2013-03-05 ENCOUNTER — Other Ambulatory Visit: Payer: Medicare Other

## 2013-03-07 ENCOUNTER — Other Ambulatory Visit (INDEPENDENT_AMBULATORY_CARE_PROVIDER_SITE_OTHER): Payer: Medicare Other

## 2013-03-07 DIAGNOSIS — I2510 Atherosclerotic heart disease of native coronary artery without angina pectoris: Secondary | ICD-10-CM

## 2013-03-07 LAB — HEPATIC FUNCTION PANEL
ALK PHOS: 78 U/L (ref 39–117)
ALT: 22 U/L (ref 0–53)
AST: 18 U/L (ref 0–37)
Albumin: 3.4 g/dL — ABNORMAL LOW (ref 3.5–5.2)
Bilirubin, Direct: 0.1 mg/dL (ref 0.0–0.3)
TOTAL PROTEIN: 6.9 g/dL (ref 6.0–8.3)
Total Bilirubin: 0.8 mg/dL (ref 0.3–1.2)

## 2013-03-07 LAB — LIPID PANEL
CHOL/HDL RATIO: 2
Cholesterol: 98 mg/dL (ref 0–200)
HDL: 48 mg/dL (ref >39.00)
LDL CALC: 38 mg/dL (ref 0–99)
Triglycerides: 58 mg/dL (ref 0.0–149.0)
VLDL: 11.6 mg/dL (ref 0.0–40.0)

## 2013-03-11 ENCOUNTER — Telehealth: Payer: Self-pay | Admitting: Cardiovascular Disease

## 2013-03-11 NOTE — Telephone Encounter (Signed)
Spoke with pt and reviewed lipid and liver profile results with him.  He also reports he is unable to attend cardiac rehab as he cannot afford the co-pay.  He is planning to walk on his own.

## 2013-03-11 NOTE — Telephone Encounter (Signed)
New Message:  Pt requesting a call back to discuss labs

## 2013-05-30 ENCOUNTER — Other Ambulatory Visit: Payer: Self-pay | Admitting: *Deleted

## 2013-05-30 DIAGNOSIS — T82598A Other mechanical complication of other cardiac and vascular devices and implants, initial encounter: Secondary | ICD-10-CM

## 2013-06-13 ENCOUNTER — Encounter: Payer: Self-pay | Admitting: Vascular Surgery

## 2013-06-14 ENCOUNTER — Other Ambulatory Visit: Payer: Self-pay

## 2013-06-14 ENCOUNTER — Ambulatory Visit (HOSPITAL_COMMUNITY)
Admission: RE | Admit: 2013-06-14 | Discharge: 2013-06-14 | Disposition: A | Discharge status: Home / Self Care | Payer: Medicare Other | Source: Ambulatory Visit | Attending: Vascular Surgery | Admitting: Vascular Surgery

## 2013-06-14 ENCOUNTER — Ambulatory Visit (INDEPENDENT_AMBULATORY_CARE_PROVIDER_SITE_OTHER): Payer: Medicare Other | Admitting: Vascular Surgery

## 2013-06-14 ENCOUNTER — Encounter: Payer: Self-pay | Admitting: Vascular Surgery

## 2013-06-14 VITALS — BP 146/86 | HR 68 | Temp 97.9°F | Resp 16 | Wt 130.0 lb

## 2013-06-14 DIAGNOSIS — T82598A Other mechanical complication of other cardiac and vascular devices and implants, initial encounter: Secondary | ICD-10-CM

## 2013-06-14 DIAGNOSIS — N186 End stage renal disease: Secondary | ICD-10-CM

## 2013-06-14 DIAGNOSIS — T82510A Breakdown (mechanical) of surgically created arteriovenous fistula, initial encounter: Secondary | ICD-10-CM

## 2013-06-14 DIAGNOSIS — T82898A Other specified complication of vascular prosthetic devices, implants and grafts, initial encounter: Secondary | ICD-10-CM | POA: Insufficient documentation

## 2013-06-14 DIAGNOSIS — Y841 Kidney dialysis as the cause of abnormal reaction of the patient, or of later complication, without mention of misadventure at the time of the procedure: Secondary | ICD-10-CM | POA: Insufficient documentation

## 2013-06-14 NOTE — Progress Notes (Signed)
Established Dialysis Access  History of Present Illness  Stanley Stewart is a 58 y.o. 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  The patient is right hand dominant.  Previous access procedures have been completed in the left arm.  The patient's complication from previous access procedures include: aneurysmal growth.  The patient denies any bleeding complications.  Past Medical History  Diagnosis Date  . ESRD (end stage renal disease)     Patient started dialysis around 2010.  Pt states the cause of ESRD was lupus and/or HBP.  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 Gets hemodialysis at [facility]) on MWF schedule using L arm AVF.    [name] Pseudoaneurysm     Enlarged Left   FA    AVF  . Hypertension   . Anemia   . Arthritis   . Lupus 2002  . Peripheral neuropathy   . Herpes zoster   . 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 Vertigo   . Iritis   . Coronary artery disease     a. 12/2012 Cath/PTCA: LM nl, LAD 19m, LCX nl, RCA 30d, RPDA 99 (PTCA only - 1.5-66mm vessel), EF 55%, subtle apical HK.    Past Surgical History  Procedure Laterality Date  . Av fistula placement  05/28/2006    LEFT forearm AVF  . Fracture surgery  Oct. 2009    Right Hip  . Lymph node biopsy right arm    . Cardiac catheterization  12/20/2012  . Coronary angioplasty  12/20/2012    History   Social History  . Marital Status: Married    Spouse Name: N/A    Number of Children: 2  . Years of Education: N/A   Occupational History  . Disability    Social History Main Topics  . Smoking status: Never Smoker   . Smokeless tobacco: Never Used  . Alcohol Use: No  . Drug Use: No  . Sexual Activity: Not on file   Other Topics Concern  . Not on file   Social History Narrative  . No narrative on file    Family History  Problem Relation Age of Onset  . Hypertension Mother   . CAD Father     . Hypertension Father     Current Outpatient Prescriptions on File Prior to Visit  Medication Sig Dispense Refill  . atorvastatin (LIPITOR) 40 MG tablet Take 1 tablet (40 mg total) by mouth daily at 6 PM.  30 tablet  6  . B Complex-C-Folic Acid (DIALYVITE Q000111Q PO) Take 1 tablet by mouth daily.       . cinacalcet (SENSIPAR) 30 MG tablet Take 30 mg by mouth daily.       . clopidogrel (PLAVIX) 75 MG tablet Take 1 tablet (75 mg total) by mouth daily with breakfast.  30 tablet  6  . hydroxychloroquine (PLAQUENIL) 200 MG tablet Take 200 mg by mouth daily.       [name] losartan (COZAAR) 50 MG tablet Take 50 mg by mouth daily.      . predniSONE (DELTASONE) 5 MG tablet Take 5 mg by mouth daily.       [name] aspirin 81 MG chewable tablet Chew 1 tablet (81 mg total) by mouth daily.       No current facility-administered medications on file prior to visit.    Allergies  Allergen Reactions  . Neurontin [  Gabapentin] Other (See Comments)    Unknown   . Penicillins Other (See Comments)    Patient states that MD told him he was allergic to this med but he does not recall having any reactions   . Amitriptyline Rash    REVIEW OF SYSTEMS:  (Positives checked otherwise negative)  CARDIOVASCULAR:  []  chest pain, []  chest pressure, []  palpitations, []  shortness of breath when laying flat, []  shortness of breath with exertion,  []  pain in feet when walking, []  pain in feet when laying flat, []  history of blood clot in veins (DVT), []  history of phlebitis, []  swelling in legs, []  varicose veins  PULMONARY:  []  productive cough, []  asthma, []  wheezing  NEUROLOGIC:  []  weakness in arms or legs, []  numbness in arms or legs, []  difficulty speaking or slurred speech, []  temporary loss of vision in one eye, []  dizziness  HEMATOLOGIC:  []  bleeding problems, []  problems with blood clotting too easily  MUSCULOSKEL:  []  joint pain, []  joint swelling  GASTROINTEST:  []  vomiting blood, []  blood in stool     GENITOURINARY:   []  burning with urination, []  blood in urine, [x]  ESRD-HD: M-W-F  PSYCHIATRIC:  []  history of major depression  INTEGUMENTARY:  []  rashes, []  ulcers     Physical Examination  Filed Vitals:   06/14/13 1147  BP: 146/86  Pulse: 68  Temp: 97.9 F (36.6 C)  TempSrc: Oral  Resp: 16  Weight: 130 lb (58.968 kg)  SpO2: 98%   Body mass index is 19.19 kg/(m^2).  General: A&O x 3, WD, WN  Pulmonary: Sym exp, good air movt, CTAB, no rales, rhonchi, & wheezing  Cardiac: RRR, Nl S1, S2, no Murmurs, rubs or gallops Vascular: Vessel Right Left  Radial Palpable Palpable  Ulnar Not Palpable Not Palpable  Brachial Palpable Palpable   Gastrointestinal: soft, NTND, -G/R, - HSM, - masses, - CVAT B  Musculoskeletal: M/S 5/5 throughout , Extremities without  ischemic changes , palpable thrill in access in L RC AVF, + bruit in access, aneurysm L RC AVF with large pseudoaneurysmal mid-segment, smaller one more proximally  Neurologic: Pain and light touch intact in extremities , Motor exam as listed above  Non-Invasive Vascular Imaging  left arm Access Duplex  (Date: 06/14/2013):   Diameters:  0.5-3.1 cm (3.1 cm at large segment)   Medical Decision Making  [name] is a 58 y.o. male who presents with ESRD requiring hemodialysis, large left RC AVF pseudoaneurysm  I discussed with the patient plicating the larger pseudoaneurysm to decrease his risk of bleeding.  Risk, benefits, and alternatives to access surgery were discussed.  The patient is aware the risks include but are not limited to: bleeding, infection, steal syndrome, nerve damage, need for additional procedures, death and stroke.    The patient agrees to proceed forward with the procedure.  This is scheduled for 30 JUN 15.  Adele Barthel, MD [facility] Office: [phone] Pager: [phone]  06/14/2013, 12:35 PM

## 2013-06-27 ENCOUNTER — Encounter (HOSPITAL_COMMUNITY): Payer: Self-pay | Admitting: Pharmacy Technician

## 2013-07-15 ENCOUNTER — Encounter (HOSPITAL_COMMUNITY): Payer: Self-pay | Admitting: *Deleted

## 2013-07-15 MED ORDER — VANCOMYCIN HCL IN DEXTROSE 1-5 GM/200ML-% IV SOLN
1000.0000 mg | INTRAVENOUS | Status: AC
  Administered 2013-07-16: 1000 mg via INTRAVENOUS
  Filled 2013-07-15: qty 200

## 2013-07-15 NOTE — Progress Notes (Signed)
Pt had cardiac cath is PTCA (1 vessel) in Dec. 2014. Pt states he's done great since. Has not had any chest pain/sob. Has appt with Dr. Julianne Handler in August for f/u.

## 2013-07-15 NOTE — Progress Notes (Signed)
07/15/13 0915  OBSTRUCTIVE SLEEP APNEA  Have you ever been diagnosed with sleep apnea through a sleep study? No  Do you snore loudly (loud enough to be heard through closed doors)?  1  Do you often feel tired, fatigued, or sleepy during the daytime? 1  Has anyone observed you stop breathing during your sleep? 0  Do you have, or are you being treated for high blood pressure? 1  BMI more than 35 kg/m2? 0  Age over 56 years old? 1  Neck circumference greater than 40 cm/16 inches? 0 (15.5)  Gender: 1  Obstructive Sleep Apnea Score 5  Score 4 or greater  Results sent to PCP

## 2013-07-15 NOTE — Progress Notes (Signed)
Pt notified of surgery time change and instructed to be here at 5:30. Pt voiced understanding.

## 2013-07-16 ENCOUNTER — Telehealth: Payer: Self-pay | Admitting: Vascular Surgery

## 2013-07-16 ENCOUNTER — Ambulatory Visit (HOSPITAL_COMMUNITY): Payer: Medicare Other | Admitting: Anesthesiology

## 2013-07-16 ENCOUNTER — Encounter (HOSPITAL_COMMUNITY)
Admission: RE | Disposition: A | Discharge status: Home / Self Care | Payer: Self-pay | Source: Ambulatory Visit | Attending: Vascular Surgery

## 2013-07-16 ENCOUNTER — Encounter (HOSPITAL_COMMUNITY): Payer: Medicare Other | Admitting: Anesthesiology

## 2013-07-16 ENCOUNTER — Ambulatory Visit (HOSPITAL_COMMUNITY)
Admission: RE | Admit: 2013-07-16 | Discharge: 2013-07-16 | Disposition: A | Discharge status: Home / Self Care | Payer: Medicare Other | Source: Ambulatory Visit | Attending: Vascular Surgery | Admitting: Vascular Surgery

## 2013-07-16 ENCOUNTER — Ambulatory Visit (HOSPITAL_COMMUNITY): Payer: Medicare Other

## 2013-07-16 ENCOUNTER — Encounter (HOSPITAL_COMMUNITY): Payer: Self-pay | Admitting: Anesthesiology

## 2013-07-16 DIAGNOSIS — Y849 Medical procedure, unspecified as the cause of abnormal reaction of the patient, or of later complication, without mention of misadventure at the time of the procedure: Secondary | ICD-10-CM | POA: Insufficient documentation

## 2013-07-16 DIAGNOSIS — I120 Hypertensive chronic kidney disease with stage 5 chronic kidney disease or end stage renal disease: Secondary | ICD-10-CM | POA: Insufficient documentation

## 2013-07-16 DIAGNOSIS — D649 Anemia, unspecified: Secondary | ICD-10-CM | POA: Insufficient documentation

## 2013-07-16 DIAGNOSIS — T82898A Other specified complication of vascular prosthetic devices, implants and grafts, initial encounter: Secondary | ICD-10-CM | POA: Insufficient documentation

## 2013-07-16 DIAGNOSIS — T82510A Breakdown (mechanical) of surgically created arteriovenous fistula, initial encounter: Secondary | ICD-10-CM

## 2013-07-16 DIAGNOSIS — I770 Arteriovenous fistula, acquired: Secondary | ICD-10-CM | POA: Insufficient documentation

## 2013-07-16 DIAGNOSIS — Z7902 Long term (current) use of antithrombotics/antiplatelets: Secondary | ICD-10-CM | POA: Insufficient documentation

## 2013-07-16 DIAGNOSIS — N186 End stage renal disease: Secondary | ICD-10-CM | POA: Insufficient documentation

## 2013-07-16 DIAGNOSIS — Z992 Dependence on renal dialysis: Secondary | ICD-10-CM | POA: Insufficient documentation

## 2013-07-16 DIAGNOSIS — Z9861 Coronary angioplasty status: Secondary | ICD-10-CM | POA: Insufficient documentation

## 2013-07-16 DIAGNOSIS — I2510 Atherosclerotic heart disease of native coronary artery without angina pectoris: Secondary | ICD-10-CM | POA: Insufficient documentation

## 2013-07-16 DIAGNOSIS — G609 Hereditary and idiopathic neuropathy, unspecified: Secondary | ICD-10-CM | POA: Insufficient documentation

## 2013-07-16 HISTORY — PX: FISTULA SUPERFICIALIZATION: SHX6341

## 2013-07-16 LAB — POCT I-STAT 4, (NA,K, GLUC, HGB,HCT)
Glucose, Bld: 86 mg/dL (ref 70–99)
HEMATOCRIT: 32 % — AB (ref 39.0–52.0)
Hemoglobin: 10.9 g/dL — ABNORMAL LOW (ref 13.0–17.0)
Potassium: 4.7 mEq/L (ref 3.7–5.3)
Sodium: 139 mEq/L (ref 137–147)

## 2013-07-16 SURGERY — FISTULA SUPERFICIALIZATION
Anesthesia: General | Site: Arm Lower | Laterality: Left

## 2013-07-16 MED ORDER — OXYCODONE HCL 5 MG PO TABS: 5.0000 mg | ORAL_TABLET | Freq: Four times a day (QID) | ORAL | Status: DC | PRN

## 2013-07-16 MED ORDER — HEPARIN SODIUM (PORCINE) 1000 UNIT/ML IJ SOLN
INTRAMUSCULAR | Status: DC | PRN
  Administered 2013-07-16: 5000 [IU] via INTRAVENOUS

## 2013-07-16 MED ORDER — PROTAMINE SULFATE 10 MG/ML IV SOLN
INTRAVENOUS | Status: AC
  Filled 2013-07-16: qty 5

## 2013-07-16 MED ORDER — FENTANYL CITRATE 0.05 MG/ML IJ SOLN
INTRAMUSCULAR | Status: AC
  Filled 2013-07-16: qty 5

## 2013-07-16 MED ORDER — OXYCODONE HCL 5 MG PO TABS: 5.0000 mg | ORAL_TABLET | Freq: Once | ORAL | Status: DC | PRN

## 2013-07-16 MED ORDER — ONDANSETRON HCL 4 MG/2ML IJ SOLN
INTRAMUSCULAR | Status: AC
  Filled 2013-07-16: qty 2

## 2013-07-16 MED ORDER — CHLORHEXIDINE GLUCONATE CLOTH 2 % EX PADS: 6.0000 | MEDICATED_PAD | Freq: Once | CUTANEOUS | Status: DC

## 2013-07-16 MED ORDER — PROPOFOL 10 MG/ML IV BOLUS
INTRAVENOUS | Status: AC
  Filled 2013-07-16: qty 20

## 2013-07-16 MED ORDER — LIDOCAINE HCL (CARDIAC) 20 MG/ML IV SOLN
INTRAVENOUS | Status: AC
  Filled 2013-07-16: qty 5

## 2013-07-16 MED ORDER — FENTANYL CITRATE 0.05 MG/ML IJ SOLN
25.0000 ug | INTRAMUSCULAR | Status: DC | PRN
  Administered 2013-07-16 (×2): 50 ug via INTRAVENOUS

## 2013-07-16 MED ORDER — PROPOFOL 10 MG/ML IV BOLUS
INTRAVENOUS | Status: DC | PRN
  Administered 2013-07-16: 130 mg via INTRAVENOUS

## 2013-07-16 MED ORDER — SODIUM CHLORIDE 0.9 % IR SOLN
Status: DC | PRN
  Administered 2013-07-16: 07:00:00

## 2013-07-16 MED ORDER — MIDAZOLAM HCL 2 MG/2ML IJ SOLN
INTRAMUSCULAR | Status: AC
  Filled 2013-07-16: qty 2

## 2013-07-16 MED ORDER — MIDAZOLAM HCL 5 MG/5ML IJ SOLN
INTRAMUSCULAR | Status: DC | PRN
  Administered 2013-07-16: 1 mg via INTRAVENOUS

## 2013-07-16 MED ORDER — FENTANYL CITRATE 0.05 MG/ML IJ SOLN
INTRAMUSCULAR | Status: AC
  Filled 2013-07-16: qty 2

## 2013-07-16 MED ORDER — OXYCODONE HCL 5 MG/5ML PO SOLN: 5.0000 mg | Freq: Once | ORAL | Status: DC | PRN

## 2013-07-16 MED ORDER — LIDOCAINE HCL (CARDIAC) 20 MG/ML IV SOLN
INTRAVENOUS | Status: DC | PRN
  Administered 2013-07-16: 40 mg via INTRAVENOUS

## 2013-07-16 MED ORDER — DEXTROSE 5 % IV SOLN
INTRAVENOUS | Status: DC | PRN
  Administered 2013-07-16: 07:00:00 via INTRAVENOUS

## 2013-07-16 MED ORDER — FENTANYL CITRATE 0.05 MG/ML IJ SOLN
INTRAMUSCULAR | Status: DC | PRN
  Administered 2013-07-16: 50 ug via INTRAVENOUS
  Administered 2013-07-16: 25 ug via INTRAVENOUS
  Administered 2013-07-16: 50 ug via INTRAVENOUS

## 2013-07-16 MED ORDER — PROTAMINE SULFATE 10 MG/ML IV SOLN
INTRAVENOUS | Status: DC | PRN
  Administered 2013-07-16: 15 mg via INTRAVENOUS

## 2013-07-16 MED ORDER — HEPARIN SODIUM (PORCINE) 1000 UNIT/ML IJ SOLN
INTRAMUSCULAR | Status: AC
  Filled 2013-07-16: qty 1

## 2013-07-16 MED ORDER — ONDANSETRON HCL 4 MG/2ML IJ SOLN: 4.0000 mg | Freq: Once | INTRAMUSCULAR | Status: DC | PRN

## 2013-07-16 MED ORDER — SODIUM CHLORIDE 0.9 % IV SOLN
INTRAVENOUS | Status: DC
  Administered 2013-07-16: 07:00:00 via INTRAVENOUS

## 2013-07-16 MED ORDER — THROMBIN 20000 UNITS EX SOLR
CUTANEOUS | Status: AC
  Filled 2013-07-16: qty 20000

## 2013-07-16 MED ORDER — THROMBIN 20000 UNITS EX SOLR
CUTANEOUS | Status: DC | PRN
  Administered 2013-07-16: 08:00:00 via TOPICAL

## 2013-07-16 MED ORDER — ONDANSETRON HCL 4 MG/2ML IJ SOLN
INTRAMUSCULAR | Status: DC | PRN
  Administered 2013-07-16: 4 mg via INTRAVENOUS

## 2013-07-16 MED ORDER — 0.9 % SODIUM CHLORIDE (POUR BTL) OPTIME
TOPICAL | Status: DC | PRN
  Administered 2013-07-16: 1000 mL

## 2013-07-16 SURGICAL SUPPLY — 39 items
BLADE 10 SAFETY STRL DISP (BLADE) ×3 IMPLANT
CANISTER SUCTION 2500CC (MISCELLANEOUS) ×3 IMPLANT
CLIP TI MEDIUM 6 (CLIP) ×3 IMPLANT
CLIP TI WIDE RED SMALL 6 (CLIP) ×3 IMPLANT
COVER PROBE W GEL 5X96 (DRAPES) IMPLANT
COVER SURGICAL LIGHT HANDLE (MISCELLANEOUS) ×3 IMPLANT
DECANTER SPIKE VIAL GLASS SM (MISCELLANEOUS) ×3 IMPLANT
DERMABOND ADVANCED (GAUZE/BANDAGES/DRESSINGS)
DERMABOND ADVANCED .7 DNX12 (GAUZE/BANDAGES/DRESSINGS) IMPLANT
DRAIN PENROSE 1/2X12 LTX STRL (WOUND CARE) IMPLANT
ELECT REM PT RETURN 9FT ADLT (ELECTROSURGICAL) ×3
ELECTRODE REM PT RTRN 9FT ADLT (ELECTROSURGICAL) ×1 IMPLANT
GLOVE BIO SURGEON STRL SZ7 (GLOVE) ×3 IMPLANT
GLOVE BIOGEL M 6.5 STRL (GLOVE) ×3 IMPLANT
GLOVE BIOGEL M STER SZ 6 (GLOVE) ×3 IMPLANT
GLOVE BIOGEL PI IND STRL 6.5 (GLOVE) ×3 IMPLANT
GLOVE BIOGEL PI IND STRL 7.5 (GLOVE) ×1 IMPLANT
GLOVE BIOGEL PI INDICATOR 6.5 (GLOVE) ×6
GLOVE BIOGEL PI INDICATOR 7.5 (GLOVE) ×2
GOWN STRL REUS W/ TWL LRG LVL3 (GOWN DISPOSABLE) ×3 IMPLANT
GOWN STRL REUS W/TWL LRG LVL3 (GOWN DISPOSABLE) ×6
KIT BASIN OR (CUSTOM PROCEDURE TRAY) ×3 IMPLANT
KIT ROOM TURNOVER OR (KITS) ×3 IMPLANT
NS IRRIG 1000ML POUR BTL (IV SOLUTION) ×3 IMPLANT
PACK CV ACCESS (CUSTOM PROCEDURE TRAY) ×3 IMPLANT
PAD ARMBOARD 7.5X6 YLW CONV (MISCELLANEOUS) ×6 IMPLANT
SPONGE GAUZE 4X4 12PLY STER LF (GAUZE/BANDAGES/DRESSINGS) ×3 IMPLANT
SPONGE SURGIFOAM ABS GEL 100 (HEMOSTASIS) IMPLANT
STAPLER VISISTAT (STAPLE) ×3 IMPLANT
SUT MNCRL AB 4-0 PS2 18 (SUTURE) ×3 IMPLANT
SUT PROLENE 6 0 BV (SUTURE) ×9 IMPLANT
SUT PROLENE 7 0 BV 1 (SUTURE) ×3 IMPLANT
SUT VIC AB 3-0 SH 27 (SUTURE) ×2
SUT VIC AB 3-0 SH 27X BRD (SUTURE) ×1 IMPLANT
TAPE PAPER 3X10 WHT MICROPORE (GAUZE/BANDAGES/DRESSINGS) ×3 IMPLANT
TOWEL OR 17X24 6PK STRL BLUE (TOWEL DISPOSABLE) ×3 IMPLANT
TOWEL OR 17X26 10 PK STRL BLUE (TOWEL DISPOSABLE) ×3 IMPLANT
UNDERPAD 30X30 INCONTINENT (UNDERPADS AND DIAPERS) ×3 IMPLANT
WATER STERILE IRR 1000ML POUR (IV SOLUTION) ×3 IMPLANT

## 2013-07-16 NOTE — Anesthesia Preprocedure Evaluation (Addendum)
Anesthesia Evaluation  Patient identified by MRN, date of birth, ID band Patient awake    Reviewed: Allergy & Precautions, H&P , NPO status , Patient's Chart, lab work & pertinent test results, reviewed documented beta blocker date and time   Airway Mallampati: II TM Distance: >3 FB Neck ROM: Full    Dental  (+) Teeth Intact, Dental Advisory Given   Pulmonary          Cardiovascular hypertension, Pt. on medications     Neuro/Psych    GI/Hepatic   Endo/Other    Renal/GU ESRF and DialysisRenal diseaseM-W-FR     Musculoskeletal   Abdominal   Peds  Hematology   Anesthesia Other Findings   Reproductive/Obstetrics                         Anesthesia Physical Anesthesia Plan  ASA: III  Anesthesia Plan:    Post-op Pain Management:    Induction:   Airway Management Planned:   Additional Equipment:   Intra-op Plan:   Post-operative Plan:   Informed Consent:   Plan Discussed with:   Anesthesia Plan Comments:         Anesthesia Quick Evaluation

## 2013-07-16 NOTE — Anesthesia Postprocedure Evaluation (Signed)
  Anesthesia Post-op Note  Patient: Stanley Stewart  Procedure(s) Performed: Procedure(s): PLICATION OF RADIOCEPHALIC ARTERIOVENOUS FISTULA (Left)  Patient Location: PACU  Anesthesia Type:General  Level of Consciousness: awake, alert  and oriented  Airway and Oxygen Therapy: Patient Spontanous Breathing  Post-op Pain: mild  Post-op Assessment: Post-op Vital signs reviewed, Patient's Cardiovascular Status Stable, Respiratory Function Stable, Patent Airway and Pain level controlled  Post-op Vital Signs: stable  Last Vitals:  Filed Vitals:   07/16/13 0945  BP: 143/71  Pulse: 70  Temp: 36.6 C  Resp: 12    Complications: No apparent anesthesia complications

## 2013-07-16 NOTE — Progress Notes (Signed)
Dialysis access record faxed to [id number]

## 2013-07-16 NOTE — Telephone Encounter (Signed)
Patients spouse will have him call when he wakes up to get appt info, dpm

## 2013-07-16 NOTE — Transfer of Care (Signed)
Immediate Anesthesia Transfer of Care Note  Patient: Stanley Stewart  Procedure(s) Performed: Procedure(s): PLICATION OF RADIOCEPHALIC ARTERIOVENOUS FISTULA (Left)  Patient Location: PACU  Anesthesia Type:General  Level of Consciousness: awake, alert , oriented and patient cooperative  Airway & Oxygen Therapy: Patient Spontanous Breathing and Patient connected to nasal cannula oxygen  Post-op Assessment: Report given to PACU RN, Post -op Vital signs reviewed and stable and Patient moving all extremities  Post vital signs: Reviewed and stable  Complications: No apparent anesthesia complications

## 2013-07-16 NOTE — H&P (Signed)
Brief History and Physical  History of Present Illness  Stanley Stewart is a 58 y.o. male who presents with chief complaint: L RC AVF PSA.  The patient presents today for plication of L RC AVF PSA.    Past Medical History  Diagnosis Date  . ESRD (end stage renal disease)     Patient started dialysis around 2010.  Pt states the cause of ESRD was lupus and/or HBP.  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 Gets hemodialysis at [facility]) on MWF schedule using L arm AVF.    [name] Pseudoaneurysm     Enlarged Left   FA    AVF  . Hypertension   . Anemia   . Arthritis   . Lupus 2002  . Peripheral neuropathy   . Herpes zoster   . 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 Vertigo   . Iritis   . Coronary artery disease     a. 12/2012 Cath/PTCA: LM nl, LAD 10m, LCX nl, RCA 30d, RPDA 99 (PTCA only - 1.5-53mm vessel), EF 55%, subtle apical HK.    Past Surgical History  Procedure Laterality Date  . Av fistula placement  05/28/2006    LEFT forearm AVF  . Fracture surgery  Oct. 2009    Right Hip  . Lymph node biopsy right arm    . Cardiac catheterization  12/20/2012  . Coronary angioplasty  12/20/2012  . Vasectomy    . Colonoscopy      History   Social History  . Marital Status: Married    Spouse Name: N/A    Number of Children: 2  . Years of Education: N/A   Occupational History  . Disability    Social History Main Topics  . Smoking status: Never Smoker   . Smokeless tobacco: Never Used  . Alcohol Use: No  . Drug Use: No  . Sexual Activity: Not on file   Other Topics Concern  . Not on file   Social History Narrative  . No narrative on file    Family History  Problem Relation Age of Onset  . Hypertension Mother   . CAD Father   . Hypertension Father     No current facility-administered medications on file prior to encounter.   Current Outpatient Prescriptions on File Prior to  Encounter  Medication Sig Dispense Refill  . atorvastatin (LIPITOR) 40 MG tablet Take 1 tablet (40 mg total) by mouth daily at 6 PM.  30 tablet  6  . cinacalcet (SENSIPAR) 30 MG tablet Take 30 mg by mouth every other day.       . clopidogrel (PLAVIX) 75 MG tablet Take 1 tablet (75 mg total) by mouth daily with breakfast.  30 tablet  6  . hydroxychloroquine (PLAQUENIL) 200 MG tablet Take 200 mg by mouth daily.       [name] losartan (COZAAR) 50 MG tablet Take 50 mg by mouth daily.      . predniSONE (DELTASONE) 5 MG tablet Take 5 mg by mouth daily.         Allergies  Allergen Reactions  . Adhesive [Tape]     Pulls skin off,  ONLY use paper tape.   . Neurontin [Gabapentin] Other (See Comments)    Unknown   . Penicillins Other (See Comments)    Patient states that MD told him he was allergic to this med but he  does not recall having any reactions   . Amitriptyline Rash    Review of Systems: As listed above, otherwise negative.  Physical Examination  Filed Vitals:   07/16/13 0612  BP: 142/80  Pulse: 70  Temp: 98.1 F (36.7 C)  TempSrc: Oral  Resp: 20  Height: 5\' 9"  (1.753 m)  Weight: 132 lb (59.875 kg)  SpO2: 100%    General: A&O x 3, WDWN  Pulmonary: Sym exp, good air movt, CTAB, no rales, rhonchi, & wheezing   Cardiac: RRR, Nl S1, S2, no Murmurs, rubs or gallops  Gastrointestinal: soft, NTND, -G/R, - HSM, - masses, - CVAT B  Musculoskeletal: M/S 5/5 throughout , Extremities without ischemic changes , L RC AVF PSA x 2 (distal larger)  Laboratory See Sweetwater is a 58 y.o. male who presents with: L RC AVF PSA.   The patient is scheduled for: plication of L RC AVF PSA.  The larger PSA will be plicated today, followed by the proximal some time in the future if it degenerates further  Risk, benefits, and alternatives to access surgery were discussed.  The patient is aware the risks include but are not limited to: bleeding,  infection, steal syndrome, nerve damage, ischemic monomelic neuropathy, failure to mature, and need for additional procedures.  The patient is aware of the risks and agrees to proceed.  Adele Barthel, MD [facility] [place] Office: [phone] Pager: [phone]  07/16/2013, 7:22 AM  '

## 2013-07-16 NOTE — Telephone Encounter (Signed)
Message copied by Gena Fray on Tue Jul 16, 2013  2:52 PM ------      Message from: Mena Goes      Created: Tue Jul 16, 2013  9:03 AM      Regarding: Schedule                   ----- Message -----         From: Alvia Grove, PA-C         Sent: 07/16/2013   8:44 AM           To: Vvs Charge Pool            S/p plication of left RC AVF 07/16/13            F/u in 2 weeks for staple removal - nurse only appt            F/u in 4 weeks with Dr. Bridgett Larsson            Thanks,      Maudie Mercury ------

## 2013-07-16 NOTE — Op Note (Signed)
    OPERATIVE NOTE   PROCEDURE: Plication of left radiocephalic arteriovenous fistula   PRE-OPERATIVE DIAGNOSIS: left radiocephalic arteriovenous fistula pseudoaneurysm  POST-OPERATIVE DIAGNOSIS: same as above   SURGEON: Adele Barthel, MD  ASSISTANT(S): Silva Bandy, PAC   ANESTHESIA: general  ESTIMATED BLOOD LOSS: 50 cc  FINDING(S): 1. Palpable thrill at the end of case  SPECIMEN(S):  none  INDICATIONS:   Stanley Stewart is a 59 y.o. male who presents with large left radiocephalic arteriovenous fistula pseudoaneurysm at risk for bleeding.  We discussed removing the left radiocephalic arteriovenous fistula   pseudoaneurysms in a staged fashion, plicating the largest one first.  Risk, benefits, and alternatives to access surgery were discussed.  The patient is aware the risks include but are not limited to: bleeding, infection, steal syndrome, nerve damage, ischemic monomelic neuropathy, failure to mature, need for additional procedures, death and stroke.  The patient agrees to proceed forward with the procedure.   DESCRIPTION: After obtaining full informed written consent, the patient was brought back to the operating room and placed supine upon the operating table.  The patient received IV antibiotics prior to induction.  After obtaining adequate anesthesia, the patient was prepped and draped in the standard fashion for: left arm access procedure.  I marked out the redundant skin around the largest pseudoaneurysm on his left radiocephalic arteriovenous fistula.  I placed a sterile tourniquet around the upper arm.  The patient was given 5000 units of Heparin intravenously, which was a therapeutic bolus.  After waiting three minutes, I exsanguinated the left arm with a Esmark bandage.  I inflated the tourniquet to 250 mm Hg.  I made a longitudinal incision over the marked pseudoaneurysm.  I dissected out the pseudoaneurysmal wall and then sharply entered into the pseudoaneurysm.  This  allowed me to appreciate the degree of redundancy in this segment.  I sharply tailored the skin and pseudoaneurysm, leaving enough for a 6-8 mm fistula.  I repaired the fistula with a running stitch of 6-0 Prolene.  Prior to tying the stitch, the tourniquet was released, allowing backbleeding from both ends.  No thrombus was noted.  I then reapproximated the subcutaneous tissue with a running stitch of 3-0 Vicryl.  The skin was then reapproximated with staples.  The skin was cleaned, dried, and bandaged with sterile 4 x 4.    COMPLICATIONS: none  CONDITION: stable  Adele Barthel, MD [facility] [place] Office: [phone] Pager: [phone]  07/16/2013, 8:38 AM

## 2013-07-19 ENCOUNTER — Encounter (HOSPITAL_COMMUNITY): Payer: Self-pay | Admitting: Vascular Surgery

## 2013-07-25 ENCOUNTER — Other Ambulatory Visit: Payer: Self-pay

## 2013-07-25 MED ORDER — CLOPIDOGREL BISULFATE 75 MG PO TABS: 75.0000 mg | ORAL_TABLET | Freq: Every day | ORAL | Status: DC

## 2013-07-25 MED ORDER — ATORVASTATIN CALCIUM 40 MG PO TABS: 40.0000 mg | ORAL_TABLET | Freq: Every day | ORAL | Status: DC

## 2013-07-31 ENCOUNTER — Encounter: Payer: Self-pay | Admitting: Family

## 2013-07-31 ENCOUNTER — Ambulatory Visit: Payer: Medicare Other | Admitting: Family

## 2013-08-01 ENCOUNTER — Ambulatory Visit (INDEPENDENT_AMBULATORY_CARE_PROVIDER_SITE_OTHER): Payer: Self-pay | Admitting: Family

## 2013-08-01 ENCOUNTER — Encounter: Payer: Self-pay | Admitting: Family

## 2013-08-01 VITALS — BP 115/79 | HR 81 | Temp 97.7°F | Resp 16 | Ht 69.0 in | Wt 130.0 lb

## 2013-08-01 DIAGNOSIS — Z4802 Encounter for removal of sutures: Secondary | ICD-10-CM

## 2013-08-01 NOTE — Progress Notes (Signed)
Established Dialysis Access  History of Present Illness  Stanley Stewart is a 58 y.o. (1955/10/30) male patient of Dr. Bridgett Larsson who is s/p plication of left radiocephalic arteriovenous fistula on 07/16/13. He presents today for staples removal. The patient is right hand dominant. Previous access procedures have been completed in the left arm. The patient's complication from previous access procedures include: aneurysmal growth. The patient denies any bleeding complications. He dialyzes M-W-F, HD access is proximal and distal to the plicated site on his left forearm AVF.   Past Medical History  Diagnosis Date  . ESRD (end stage renal disease)     Patient started dialysis around 2010.  Pt states the cause of ESRD was lupus and/or HBP.  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 Gets hemodialysis at [facility]) on MWF schedule using L arm AVF.    [name] Pseudoaneurysm     Enlarged Left   FA    AVF  . Hypertension   . Anemia   . Arthritis   . Lupus 2002  . Peripheral neuropathy   . Herpes zoster   . 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 Vertigo   . Iritis   . Coronary artery disease     a. 12/2012 Cath/PTCA: LM nl, LAD 24m, LCX nl, RCA 30d, RPDA 99 (PTCA only - 1.5-62mm vessel), EF 55%, subtle apical HK.    Social History History  Substance Use Topics  . Smoking status: Never Smoker   . Smokeless tobacco: Never Used  . Alcohol Use: No    Family History Family History  Problem Relation Age of Onset  . Hypertension Mother   . CAD Father   . Hypertension Father     Surgical History Past Surgical History  Procedure Laterality Date  . Av fistula placement  05/28/2006    LEFT forearm AVF  . Fracture surgery  Oct. 2009    Right Hip  . Lymph node biopsy right arm    . Cardiac catheterization  12/20/2012  . Coronary angioplasty  12/20/2012  . Vasectomy    . Colonoscopy    . Fistula superficialization  Left 99991111    Procedure: PLICATION OF RADIOCEPHALIC ARTERIOVENOUS FISTULA;  Surgeon: Conrad Loveland, MD;  Location: [place];  Service: Vascular;  Laterality: Left;    Allergies  Allergen Reactions  . Adhesive [Tape]     Pulls skin off,  ONLY use paper tape.   . Neurontin [Gabapentin] Other (See Comments)    Unknown   . Penicillins Other (See Comments)    Patient states that MD told him he was allergic to this med but he does not recall having any reactions   . Amitriptyline Rash    Current Outpatient Prescriptions  Medication Sig Dispense Refill  . atorvastatin (LIPITOR) 40 MG tablet Take 1 tablet (40 mg total) by mouth daily at 6 PM.  30 tablet  6  . cinacalcet (SENSIPAR) 30 MG tablet Take 30 mg by mouth every other day.       . clopidogrel (PLAVIX) 75 MG tablet Take 1 tablet (75 mg total) by mouth daily with breakfast.  30 tablet  3  . hydroxychloroquine (PLAQUENIL) 200 MG tablet Take 200 mg by mouth daily.       [name] losartan (COZAAR) 50 MG tablet Take 50 mg by mouth daily.      [name] oxyCODONE (ROXICODONE) 5 MG immediate release tablet  Take 1 tablet (5 mg total) by mouth every 6 (six) hours as needed for severe pain.  20 tablet  0  . predniSONE (DELTASONE) 5 MG tablet Take 5 mg by mouth daily.        No current facility-administered medications for this visit.     REVIEW OF SYSTEMS: see HPI for pertinent positives and negatives    PHYSICAL EXAMINATION:  Filed Vitals:   08/01/13 1014  BP: 115/79  Pulse: 81  Temp: 97.7 F (36.5 C)  TempSrc: Oral  Resp: 16  Height: 5\' 9"  (1.753 m)  Weight: 130 lb (58.968 kg)  SpO2: 97%   Body mass index is 19.19 kg/(m^2).  General: A&O x 3, WD, WN  Pulmonary: Sym exp, good air movt, CTAB, no rales, rhonchi, & wheezing  Cardiac: RRR, Nl S1, S2, no Murmurs, rubs or gallops   Vascular:  Vessel  Right  Left   Radial  Palpable  Palpable   Ulnar  Not Palpable  Not Palpable   Brachial  Palpable  Palpable    Gastrointestinal: soft, NTND,  -G/R, - HSM, - masses, - CVAT B   Musculoskeletal: M/S 5/5 throughout , Extremities without ischemic changes , palpable thrill in access in L RC AVF, + bruit in access, aneurysm L RC AVF with small pseudoaneurysm distally, smaller one more proximally, staples in place mid segment, surgical incision edges well proximated, no erythema, no swelling, no drainage.  Neurologic: Pain and light touch intact in extremities , Motor exam as listed above  Medical Decision Making  Stanley Stewart is a 58 y.o. male who presents for staples removal s/p plication of left radiocephalic arteriovenous fistula on 07/16/13.  Staples removed.    May access plicated site for HD in 2 weeks, no sooner.  Follow up as needed   Louis Gaw, Sharmon Leyden, RN, MSN, FNP-C [facility] [place] Office: [phone]  08/01/2013, 10:07 AM  Clinic MD: Oneida Alar

## 2013-08-13 ENCOUNTER — Encounter: Payer: Self-pay | Admitting: Cardiovascular Disease

## 2013-08-13 ENCOUNTER — Ambulatory Visit (INDEPENDENT_AMBULATORY_CARE_PROVIDER_SITE_OTHER): Payer: Medicare Other | Admitting: Cardiovascular Disease

## 2013-08-13 VITALS — BP 124/80 | HR 83 | Ht 69.0 in | Wt 132.4 lb

## 2013-08-13 DIAGNOSIS — I2510 Atherosclerotic heart disease of native coronary artery without angina pectoris: Secondary | ICD-10-CM

## 2013-08-13 DIAGNOSIS — I10 Essential (primary) hypertension: Secondary | ICD-10-CM

## 2013-08-13 NOTE — Progress Notes (Signed)
History of Present Illness: 58 yo male with history of CAD, ESRD on HD, HTN, Lupus, vertigo, anemia,  here today for cardiac follow up. I saw him 11/27/12 as a new patient for evaluation of chest pain. He has been on HD since 2010. At the first visit, he described substernal chest pain. I arranged a cardiac cath on 12/20/12 and found to have 20% diffuse LAD stenosis, heavily calcified proximal, mid and distal RCA with severe stenosis small caliber PDA treated with balloon angioplasty only. The vessel was felt to be too small for a stent (1.75-2.0 mm vessel). Echo 12/11/12 overall normal.   He is here today for follow up. He is doing well. No chest pain or SOB. Tolerating dialysis well.   Primary Care Physician: [facility] Nephrology: Dr. Cheryll Dessert   Past Medical History  Diagnosis Date  . ESRD (end stage renal disease)     Patient started dialysis around 2010.  Pt states the cause of ESRD was lupus and/or HBP.  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 Gets hemodialysis at [facility]) on MWF schedule using L arm AVF.    [name] Pseudoaneurysm     Enlarged Left   FA    AVF  . Hypertension   . Anemia   . Arthritis   . Lupus 2002  . Peripheral neuropathy   . Herpes zoster   . 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 Vertigo   . Iritis   . Coronary artery disease     a. 12/2012 Cath/PTCA: LM nl, LAD 26m, LCX nl, RCA 30d, RPDA 99 (PTCA only - 1.5-74mm vessel), EF 55%, subtle apical HK.    Past Surgical History  Procedure Laterality Date  . Av fistula placement  05/28/2006    LEFT forearm AVF  . Fracture surgery  Oct. 2009    Right Hip  . Lymph node biopsy right arm    . Cardiac catheterization  12/20/2012  . Coronary angioplasty  12/20/2012  . Vasectomy    . Colonoscopy    . Fistula superficialization Left 99991111    Procedure: PLICATION OF RADIOCEPHALIC ARTERIOVENOUS FISTULA;  Surgeon: Conrad Zion,  MD;  Location: [place];  Service: Vascular;  Laterality: Left;    Current Outpatient Prescriptions  Medication Sig Dispense Refill  . atorvastatin (LIPITOR) 40 MG tablet Take 1 tablet (40 mg total) by mouth daily at 6 PM.  30 tablet  6  . cinacalcet (SENSIPAR) 30 MG tablet Take 30 mg by mouth every other day.       . clopidogrel (PLAVIX) 75 MG tablet Take 1 tablet (75 mg total) by mouth daily with breakfast.  30 tablet  3  . hydroxychloroquine (PLAQUENIL) 200 MG tablet Take 200 mg by mouth daily.       [name] losartan (COZAAR) 50 MG tablet Take 50 mg by mouth daily.      [name] oxyCODONE (ROXICODONE) 5 MG immediate release tablet Take 1 tablet (5 mg total) by mouth every 6 (six) hours as needed for severe pain.  20 tablet  0  . predniSONE (DELTASONE) 5 MG tablet Take 5 mg by mouth daily.        No current facility-administered medications for this visit.    Allergies  Allergen Reactions  . Adhesive [Tape]     Pulls skin off,  ONLY use paper tape.   . Neurontin [Gabapentin] Other (See Comments)  Unknown   . Penicillins Other (See Comments)    Patient states that MD told him he was allergic to this med but he does not recall having any reactions   . Amitriptyline Rash    History   Social History  . Marital Status: Married    Spouse Name: N/A    Number of Children: 2  . Years of Education: N/A   Occupational History  . Disability    Social History Main Topics  . Smoking status: Never Smoker   . Smokeless tobacco: Never Used  . Alcohol Use: No  . Drug Use: No  . Sexual Activity: Not on file   Other Topics Concern  . Not on file   Social History Narrative  . No narrative on file    Family History  Problem Relation Age of Onset  . Hypertension Mother   . CAD Father   . Hypertension Father     Review of Systems:  As stated in the HPI and otherwise negative.   BP 124/80  Pulse 83  Ht 5\' 9"  (1.753 m)  Wt 132 lb 6.4 oz (60.056 kg)  BMI 19.54 kg/m2  Physical  Examination: General: Well developed, well nourished, NAD HEENT: OP clear, mucus membranes moist SKIN: warm, dry. No rashes. Neuro: No focal deficits Musculoskeletal: Muscle strength 5/5 all ext Psychiatric: Mood and affect normal Neck: No JVD, no carotid bruits, no thyromegaly, no lymphadenopathy. Lungs:Clear bilaterally, no wheezes, rhonci, crackles Cardiovascular: Regular rate and rhythm. No murmurs, gallops or rubs. Abdomen:Soft. Bowel sounds present. Non-tender.  Extremities: No lower extremity edema. Pulses are 2 + in the bilateral DP/PT.  Cardiac cath 12/20/12: Left main: No obstructive disease  Left Anterior Descending Artery: Large caliber vessel that courses to the apex. The mid vessel has diffuse 20% stenosis. The diagonal branch is moderate in caliber and has mild plaque disease.  Circumflex Artery: Large caliber vessel with moderate caliber intermediate branch. The intermediate branch has mild plaque disease. The AV groove Circumflex terminates into a small obtuse marginal branch.  Right Coronary Artery: Large dominant vessel with heavy calcification noted in the proximal, mid and distal vessel. The mid and distal vessel has serial 30% stenoses. The PDA is a 1.75-2.0 mm vessel with proximal 99% stenosis.  Left Ventricular Angiogram: LVEF=55%. Subtle hypokinesis at apex.  Impression:  1. Severe single vessel CAD  2. Unstable angina (class III) secondary to severe stenosis PDA  3. Successful PTCA (balloon angioplasty only) of the PDA. Too small in caliber for a stent but likely culprit for angina.  4. Preserved LV systolic function  Echo [id number]: Left ventricle: The cavity size was normal. Wall thickness was increased in a pattern of mild LVH with moderate basal septal hypertrophy. Systolic function was normal. The estimated ejection fraction was in the range of 55% to 60%. Wall motion was normal; there were no regional wall motion abnormalities. Doppler parameters are  consistent with abnormal left ventricular relaxation (grade 1 diastolic dysfunction). Doppler parameters are consistent with high ventricular filling pressure. - Aortic valve: Mildly calcified annulus. Trileaflet; mildly thickened leaflets. Trivial regurgitation. - Mitral valve: Calcified annulus. Trivial regurgitation. - Left atrium: The atrium was mildly dilated. - Right atrium: Central venous pressure: 42mm Hg (est). - Atrial septum: No defect or patent foramen ovale was identified. - Tricuspid valve: Trivial regurgitation. - Pulmonary arteries: Systolic pressure could not be accurately estimated. - Pericardium, extracardiac: There was no pericardial effusion. Impressions:  - No prior study for comparison. Mild LVH with  moderate basal septal hypertrophy and LVEF 55-60%. Grade 1 diastolic dysfunction with increased filling pressures. Mild left atrial enlargement. MAC with trivial mitral regurgitation. Mildly sclerotic aortic valve with trivial aortic regurgitation. Unable to assess PASP. CVP estimated in normal range. No pericardial effusion.  EKG: NSR, rate 83 bpm. LVH.   Assessment and Plan:   1. CAD: Stable post PCI of the small caliber PDA. Mild disease in the LAD and RCA. Will continue Plavix and statin.   2. HTN: BP well controlled. No changes.   3. ESRD: on HD

## 2013-08-13 NOTE — Patient Instructions (Signed)
Your physician wants you to follow-up in:  12 months.  You will receive a reminder letter in the mail two months in advance. If you don't receive a letter, please call our office to schedule the follow-up appointment.   

## 2013-08-16 ENCOUNTER — Encounter: Payer: Medicare Other | Admitting: Vascular Surgery

## 2013-08-22 ENCOUNTER — Encounter: Payer: Self-pay | Admitting: Vascular Surgery

## 2013-08-23 ENCOUNTER — Ambulatory Visit (INDEPENDENT_AMBULATORY_CARE_PROVIDER_SITE_OTHER): Payer: Self-pay | Admitting: Vascular Surgery

## 2013-08-23 ENCOUNTER — Encounter: Payer: Self-pay | Admitting: Vascular Surgery

## 2013-08-23 VITALS — BP 139/86 | HR 80 | Resp 14 | Ht 69.0 in | Wt 130.0 lb

## 2013-08-23 DIAGNOSIS — N186 End stage renal disease: Secondary | ICD-10-CM

## 2013-08-23 NOTE — Progress Notes (Signed)
    Postoperative Access Visit   History of Present Illness  Stanley Stewart is a 58 y.o. year old male who presents for postoperative follow-up for: Plication of L RC AVF PSA (Date: 07/16/13).  The patient's wounds are healed.  The patient notes no steal symptoms.  The patient is able to complete their activities of daily living.  The patient's current symptoms are: none.  The L RC AVF remains in for HD.  For VQI Use Only  PRE-ADM LIVING: Home  AMB STATUS: Ambulatory  Physical Examination Filed Vitals:   08/23/13 1426  BP: 139/86  Pulse: 80  Resp: 14    LUE: Incision is healed, skin feels warm, hand grip is 5/5, sensation in digits is intact, palpable thrill, bruit can be auscultated   Medical Decision Making  Stanley Stewart is a 58 y.o. year old male who presents s/p L RC AVF PSA plication.  The patient's access is already being used.  Thank you for allowing [place] to participate in this patient's care.  Adele Barthel, MD [facility] [place] Office: [phone] Pager: [phone]  08/23/2013, 2:57 PM

## 2013-12-05 ENCOUNTER — Other Ambulatory Visit: Payer: Self-pay | Admitting: *Deleted

## 2013-12-05 MED ORDER — CLOPIDOGREL BISULFATE 75 MG PO TABS: 75.0000 mg | ORAL_TABLET | Freq: Every day | ORAL | Status: DC

## 2013-12-12 ENCOUNTER — Encounter (HOSPITAL_COMMUNITY): Payer: Self-pay | Admitting: Cardiovascular Disease

## 2014-02-24 ENCOUNTER — Other Ambulatory Visit: Payer: Self-pay

## 2014-02-24 MED ORDER — ATORVASTATIN CALCIUM 40 MG PO TABS: 40.0000 mg | ORAL_TABLET | Freq: Every day | ORAL | Status: DC

## 2014-08-14 ENCOUNTER — Ambulatory Visit: Payer: Self-pay | Admitting: Physician Assistant

## 2014-08-18 ENCOUNTER — Other Ambulatory Visit: Payer: Self-pay

## 2014-08-18 MED ORDER — CLOPIDOGREL BISULFATE 75 MG PO TABS: 75.0000 mg | ORAL_TABLET | Freq: Every day | ORAL | Status: DC

## 2014-09-02 ENCOUNTER — Telehealth: Payer: Self-pay

## 2014-09-02 ENCOUNTER — Ambulatory Visit (INDEPENDENT_AMBULATORY_CARE_PROVIDER_SITE_OTHER): Payer: Medicare Other | Admitting: Physician Assistant

## 2014-09-02 ENCOUNTER — Encounter: Payer: Self-pay | Admitting: Physician Assistant

## 2014-09-02 VITALS — BP 162/72 | HR 84 | Ht 69.0 in | Wt 131.0 lb

## 2014-09-02 DIAGNOSIS — I10 Essential (primary) hypertension: Secondary | ICD-10-CM

## 2014-09-02 DIAGNOSIS — I2510 Atherosclerotic heart disease of native coronary artery without angina pectoris: Secondary | ICD-10-CM

## 2014-09-02 DIAGNOSIS — E785 Hyperlipidemia, unspecified: Secondary | ICD-10-CM

## 2014-09-02 MED ORDER — AMLODIPINE BESYLATE 5 MG PO TABS: 5.0000 mg | ORAL_TABLET | Freq: Every day | ORAL | Status: DC

## 2014-09-02 MED ORDER — AMLODIPINE BESYLATE 5 MG PO TABS: 5.0000 mg | ORAL_TABLET | ORAL | Status: DC

## 2014-09-02 NOTE — Patient Instructions (Addendum)
Medication Instructions:  Your physician has recommended you make the following change in your medication:  START Norvasc 5mg  daily. On the days you have dialysis take after dialysis  Labwork: None ordered  Testing/Procedures: None ordered  Follow-Up: Your physician recommends that you schedule a follow-up appointment in: 3 weeks with Armanda Heritage   Any Other Special Instructions Will Be Listed Below (If Applicable).

## 2014-09-02 NOTE — Telephone Encounter (Signed)
Pt aware of Rhonda Barrett's recommendation. Pt needs fasting lipid and lft. Pt sts that he can come to the office for fasting labs on 9/13. Lab appt scheduled for 9/13

## 2014-09-02 NOTE — Progress Notes (Signed)
Cardiology Office Note   Date:  09/02/2014   ID:  Stanley Stewart, DOB [birth year], MRN [id number]  PCP:  [name], MD  Cardiologist:  Dr. Asencion Islam, PA-C   Chief Complaint  Patient presents with  . Atherosclerosis    History of Present Illness: Stanley Stewart is a 59 y.o. male with a history of CAD, ESRD on HD, HTN, Lupus, vertigo, anemia, and preserved EF. He was last seen by Dr. [name] in August 2015.  Stanley Stewart presents for his yearly evaluation.  Stanley Stewart has been doing well over the last year. He does not get chest pain. His blood pressure used to run low but has been running higher lately, as high as [id number] systolic even after dialysis. About a week and a half ago, his losartan was increased from 50 mg daily up to 100 mg daily but his blood pressure is still running high. He is not sure why.  He has not had any chest pain. He is tolerating dialysis well. At dialysis, they have decreased his dry weight because he was having some lower extremity edema, but this has improved. He feels that he has lost muscle mass and is trying to increase his activity. He does not exercise regularly.he is having no other issues or concerns.  Past Medical History  Diagnosis Date  . ESRD (end stage renal disease)     Patient started dialysis around 2010.  Pt states the cause of ESRD was lupus and/or HBP.  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 Gets hemodialysis at [facility]) on MWF schedule using L arm AVF.    [name] Pseudoaneurysm     Enlarged Left   FA    AVF  . Hypertension   . Anemia   . Arthritis   . Lupus 2002  . Peripheral neuropathy   . Herpes zoster   . 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 Vertigo   . Iritis   . Coronary artery disease     a. 12/2012 Cath/PTCA: LM nl, LAD 34m, LCX nl, RCA 30d, RPDA 99 (PTCA only - 1.5-49mm vessel), EF 55%, subtle apical HK.     Past Surgical History  Procedure Laterality Date  . Av fistula placement  05/28/2006    LEFT forearm AVF  . Fracture surgery  Oct. 2009    Right Hip  . Lymph node biopsy right arm    . Cardiac catheterization  12/20/2012  . Coronary angioplasty  12/20/2012  . Vasectomy    . Colonoscopy    . Fistula superficialization Left [id number]    Procedure: PLICATION OF RADIOCEPHALIC ARTERIOVENOUS FISTULA;  Surgeon: Conrad Nellysford, MD;  Location: [facility];  Service: Vascular;  Laterality: Left;  . Left heart catheterization with coronary angiogram N/A 12/20/2012    Procedure: LEFT HEART CATHETERIZATION WITH CORONARY ANGIOGRAM;  Surgeon: Burnell Blanks, MD;  Location: [facility] C[facility] LAB;  Service: Cardiovascular;  Laterality: N/A;    Current Outpatient Prescriptions  Medication Sig Dispense Refill  . atorvastatin (LIPITOR) 40 MG tablet Take 1 tablet (40 mg total) by mouth daily at 6 PM. 30 tablet 6  . cinacalcet (SENSIPAR) 30 MG tablet Take 30 mg by mouth every other day.     . clopidogrel (PLAVIX) 75 MG tablet Take 1 tablet (75 mg total) by mouth daily with breakfast. 30 tablet 1  . famotidine (PEPCID) 20 MG tablet  Take 20 mg by mouth as needed for heartburn or indigestion.    . hydroxychloroquine (PLAQUENIL) 200 MG tablet Take 200 mg by mouth daily.     [name] losartan (COZAAR) 50 MG tablet Take 100 mg by mouth daily.     [name] oxyCODONE (ROXICODONE) 5 MG immediate release tablet Take 1 tablet (5 mg total) by mouth every 6 (six) hours as needed for severe pain. 20 tablet 0  . predniSONE (DELTASONE) 5 MG tablet Take 5 mg by mouth daily.     [name] amLODipine (NORVASC) 5 MG tablet Take 1 tablet (5 mg total) by mouth daily. 30 tablet 5   No current facility-administered medications for this visit.    Allergies:   Adhesive; Neurontin; Penicillins; and Amitriptyline    Social History:  The patient  reports that he has never smoked. He has never used smokeless tobacco. He reports that he does not drink alcohol  or use illicit drugs.   Family History:  The patient's family history includes CAD in his father; Hypertension in his father and mother.    ROS:  Please see the history of present illness. All other systems are reviewed and negative.    PHYSICAL EXAM: VS:  BP 162/72 mmHg  Pulse 84  Ht 5\' 9"  (1.753 m)  Wt 131 lb (59.421 kg)  BMI 19.34 kg/m2 , BMI Body mass index is 19.34 kg/(m^2). GEN: Well nourished, well developed, slender male, in no acute distress HEENT: normal Neck: no JVD, carotid bruits, or masses Cardiac: RRR; no murmurs, rubs, or gallops,no edema  Respiratory:  clear to auscultation bilaterally, normal work of breathing GI: soft, nontender, nondistended, + BS MS: significant bilateral hand deformity, decreased muscle mass Skin: warm and dry, no rash Neuro:  Strength and sensation are intact Psych: euthymic mood, full affect   EKG:  EKG is ordered today. The ekg ordered today demonstrates sinus rhythm, rate 84, LVH noted   Recent Labs: No results found for requested labs within last 365 days.    Lipid Panel    Component Value Date/Time   CHOL 98 03/07/2013 0913   TRIG 58.0 03/07/2013 0913   HDL 48.00 03/07/2013 0913   CHOLHDL 2 03/07/2013 0913   VLDL 11.6 03/07/2013 0913   LDLCALC 38 03/07/2013 0913     Wt Readings from Last 3 Encounters:  09/02/14 131 lb (59.421 kg)  08/23/13 130 lb (58.968 kg)  08/13/13 132 lb 6.4 oz (60.056 kg)     Other studies Reviewed: Additional studies/ records that were reviewed today include: previous office visits, cath reports, ECG and weights.  ASSESSMENT AND PLAN:  1.  History of CAD: Stanley Stewart is doing well from a cardiac standpoint. It is appropriate for him to increase his activity and he is encouraged to do so. His weight is low and if he can increase his muscle mass, I would be appropriate for him to gain a few pounds.  2. Hypertension: We will add Norvasc 5 mg to his medication regimen. On dialysis days, he is to  take it after dialysis. He is to continue to track his blood pressure and let us know how he is doing.  3. Deconditioning: He is encouraged to increase his activity as tolerated. Walking is recommended as an initial exercise, and he is encouraged to keep his exertion level to no more than a 5/10.  4. Dyslipidemia: His last lipid profile was in March 2015. He will get this checked prior to his next office visit  Current  medicines are reviewed at length with the patient today.  The patient does not have concerns regarding medicines.  The following changes have been made:  Norvasc 5 mg  Labs/ tests ordered today include:  Orders Placed This Encounter  Procedures  . EKG 12-Lead    Disposition:   FU with Jylan Loeza, PA-C in one month   Signed, Lenoard Aden  09/02/2014 3:53 PM    [facility] [place], [place], [place]  [address] Phone: [phone]; Fax: [phone]

## 2014-09-11 ENCOUNTER — Other Ambulatory Visit (INDEPENDENT_AMBULATORY_CARE_PROVIDER_SITE_OTHER): Payer: Medicare Other

## 2014-09-11 DIAGNOSIS — E785 Hyperlipidemia, unspecified: Secondary | ICD-10-CM | POA: Diagnosis not present

## 2014-09-11 LAB — LIPID PANEL
CHOL/HDL RATIO: 2
CHOLESTEROL: 108 mg/dL (ref 0–200)
HDL: 49.4 mg/dL (ref >39.00)
LDL CALC: 48 mg/dL (ref 0–99)
NonHDL: 58.31
Triglycerides: 50 mg/dL (ref 0.0–149.0)
VLDL: 10 mg/dL (ref 0.0–40.0)

## 2014-09-11 LAB — HEPATIC FUNCTION PANEL
ALT: 12 U/L (ref 0–53)
AST: 15 U/L (ref 0–37)
Albumin: 3.6 g/dL (ref 3.5–5.2)
Alkaline Phosphatase: 93 U/L (ref 39–117)
BILIRUBIN DIRECT: 0.1 mg/dL (ref 0.0–0.3)
BILIRUBIN TOTAL: 0.5 mg/dL (ref 0.2–1.2)
Total Protein: 7.1 g/dL (ref 6.0–8.3)

## 2014-09-16 ENCOUNTER — Other Ambulatory Visit: Payer: Medicare Other

## 2014-09-25 ENCOUNTER — Ambulatory Visit: Payer: Medicare Other | Admitting: Physician Assistant

## 2014-09-30 ENCOUNTER — Ambulatory Visit: Payer: Self-pay | Admitting: Physician Assistant

## 2014-11-05 ENCOUNTER — Telehealth: Payer: Self-pay | Admitting: Cardiovascular Disease

## 2014-11-05 NOTE — Telephone Encounter (Signed)
I returned call to [facility] and left message to call back. I placed call to pt and left message to call back.

## 2014-11-05 NOTE — Telephone Encounter (Signed)
New message     FYI Calling to let the doctor know that patient's ins company sent a letter to PCP (Dr Lysle Rubens) stating that pt was not filling his presc for atorvastatin 40mg  as prescribed.  He was not filling the presc often enough indicating that he was not taking the medication correctly.

## 2014-11-06 NOTE — Telephone Encounter (Signed)
F/u ° ° °Pt returning call from nurse. °

## 2014-11-06 NOTE — Telephone Encounter (Signed)
I spoke with the pt and made him aware of the importance of taking Atorvastatin on a daily basis.  The pt said he does miss doses of this medication. The pt will work on medication compliance.

## 2014-12-18 ENCOUNTER — Other Ambulatory Visit: Payer: Self-pay | Admitting: *Deleted

## 2014-12-18 MED ORDER — ATORVASTATIN CALCIUM 40 MG PO TABS: 40.0000 mg | ORAL_TABLET | Freq: Every day | ORAL | Status: DC

## 2014-12-18 MED ORDER — CLOPIDOGREL BISULFATE 75 MG PO TABS: 75.0000 mg | ORAL_TABLET | Freq: Every day | ORAL | Status: DC

## 2014-12-18 NOTE — Telephone Encounter (Signed)
Spoke with Dr. [name] about this pt and if we should refill his atorvastatin as pt has been non-complient with taking medication & coming back for f/u appt. Dr. [name] said to go ahead and fill medication as his labs were ok.

## 2015-03-26 ENCOUNTER — Other Ambulatory Visit: Payer: Self-pay | Admitting: Internal Medicine

## 2015-03-26 DIAGNOSIS — R1031 Right lower quadrant pain: Secondary | ICD-10-CM

## 2015-03-31 ENCOUNTER — Ambulatory Visit
Admission: RE | Admit: 2015-03-31 | Discharge: 2015-03-31 | Disposition: A | Discharge status: Home / Self Care | Payer: Medicare Other | Source: Ambulatory Visit | Attending: Internal Medicine | Admitting: Internal Medicine

## 2015-03-31 DIAGNOSIS — R1031 Right lower quadrant pain: Secondary | ICD-10-CM

## 2015-05-06 ENCOUNTER — Other Ambulatory Visit: Payer: Self-pay | Admitting: Cardiovascular Disease

## 2015-08-01 ENCOUNTER — Other Ambulatory Visit: Payer: Self-pay | Admitting: Cardiovascular Disease

## 2015-09-17 ENCOUNTER — Other Ambulatory Visit: Payer: Self-pay | Admitting: Cardiovascular Disease

## 2015-09-17 NOTE — Telephone Encounter (Signed)
clopidogrel (PLAVIX) 75 MG tablet  Medication  Date: 08/03/2015 Department: [address] Office Ordering/Authorizing: Burnell Blanks, MD  Order Providers   Prescribing Provider Encounter Provider  Burnell Blanks, MD Burnell Blanks, MD  Medication Detail    Disp Refills Start End   clopidogrel (PLAVIX) 75 MG tablet 30 tablet 0 08/03/2015    Sig: take 1 tablet by mouth once daily WITH BREAKFAST   Notes to Pharmacy: Please call our office to schedule an yearly appointment before anymore refills. [phone] thank you 1st attempt   E-Prescribing Status: Receipt confirmed by pharmacy (08/03/2015 9:20 AM EDT)   Pharmacy   [id number] [address], [place]

## 2015-10-12 ENCOUNTER — Encounter: Payer: Self-pay | Admitting: Physician Assistant

## 2015-10-13 ENCOUNTER — Other Ambulatory Visit: Payer: Self-pay | Admitting: Cardiovascular Disease

## 2015-10-13 ENCOUNTER — Encounter (INDEPENDENT_AMBULATORY_CARE_PROVIDER_SITE_OTHER): Payer: Self-pay

## 2015-10-13 ENCOUNTER — Encounter: Payer: Self-pay | Admitting: Physician Assistant

## 2015-10-13 ENCOUNTER — Ambulatory Visit (INDEPENDENT_AMBULATORY_CARE_PROVIDER_SITE_OTHER): Payer: Medicare Other | Admitting: Physician Assistant

## 2015-10-13 VITALS — BP 138/70 | HR 83 | Ht 69.0 in | Wt 123.0 lb

## 2015-10-13 DIAGNOSIS — R0789 Other chest pain: Secondary | ICD-10-CM | POA: Diagnosis not present

## 2015-10-13 DIAGNOSIS — I25119 Atherosclerotic heart disease of native coronary artery with unspecified angina pectoris: Secondary | ICD-10-CM

## 2015-10-13 DIAGNOSIS — R011 Cardiac murmur, unspecified: Secondary | ICD-10-CM | POA: Diagnosis not present

## 2015-10-13 DIAGNOSIS — N186 End stage renal disease: Secondary | ICD-10-CM | POA: Diagnosis not present

## 2015-10-13 DIAGNOSIS — I10 Essential (primary) hypertension: Secondary | ICD-10-CM

## 2015-10-13 NOTE — Progress Notes (Signed)
Cardiology Office Note    Date:  10/13/2015   ID:  Stanley Stewart, DOB 1955-08-26, MRN [id number]  PCP:  [name], MD  Cardiologist: Dr. [name]  Chief Complaint  Patient presents with  . Follow-up  . Chest Pain    History of Present Illness:  Stanley Stewart is a 60 y.o. male with a history of CAD12/2014 Cath/PTCA: LM nl, LAD 36m, LCX nl, RCA 30d, RPDA 99 (PTCA only - 1.5-17mm vessel), EF 55%, subtle apical HK, ESRD on HD, HTN, Lupus, vertigo, anemia, and preserved EF. He was last seen by Dr. [name] in August 2015.   Comes in for yearly check up. Notices chest tightness when sitting down or after eating eases in 20-30 min. Hasn't used NTG. 2-3 times in past 2 weeks. No radiation to jaw or arm, no dyspnea or indigestion. Not very active. Walks short distances, Takes care of his house and has no chest pain with activity. Was going to the gym riding the bike but quit 4 months ago. He has significant feet problems from his lupus so would have a hard time walking on treadmill.     Past Medical History:  Diagnosis Date  . Anemia   . Arthritis   . Coronary artery disease    a. 12/2012 Cath/PTCA: LM nl, LAD 3m, LCX nl, RCA 30d, RPDA 99 (PTCA only - 1.5-21mm vessel), EF 55%, subtle apical HK.  [name] ESRD (end stage renal disease) ([place])    Patient started dialysis around 2010.  Pt states the cause of ESRD was lupus and/or HBP.  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 Gets hemodialysis at [facility]) on MWF schedule using L arm AVF.    [name] Herpes zoster   . Hypertension   . Iritis   . Lupus 2002  . 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 Peripheral neuropathy ([place])   . Pseudoaneurysm ([place])    Enlarged Left   FA    AVF  . Vertigo     Past Surgical History:  Procedure Laterality Date  . AV FISTULA PLACEMENT  05/28/2006   LEFT forearm AVF  . CARDIAC CATHETERIZATION  12/20/2012  . COLONOSCOPY     . CORONARY ANGIOPLASTY  12/20/2012  . FISTULA SUPERFICIALIZATION Left 3/38/2505   Procedure: PLICATION OF RADIOCEPHALIC ARTERIOVENOUS FISTULA;  Surgeon: Conrad , MD;  Location: [facility];  Service: Vascular;  Laterality: Left;  . FRACTURE SURGERY  Oct. 2009   Right Hip  . LEFT HEART CATHETERIZATION WITH CORONARY ANGIOGRAM N/A 12/20/2012   Procedure: LEFT HEART CATHETERIZATION WITH CORONARY ANGIOGRAM;  Surgeon: Burnell Blanks, MD;  Location: [facility] CATH LAB;  Service: Cardiovascular;  Laterality: N/A;  . lymph node biopsy right arm    . VASECTOMY      Current Medications: Outpatient Medications Prior to Visit  Medication Sig Dispense Refill  . amLODipine (NORVASC) 5 MG tablet Take 1 tablet (5 mg total) by mouth daily. 30 tablet 5  . atorvastatin (LIPITOR) 40 MG tablet Take 1 tablet (40 mg total) by mouth daily at 6 PM. Please call and schedule an appointment 90 tablet 0  . cinacalcet (SENSIPAR) 30 MG tablet Take 30 mg by mouth daily with breakfast.     . clopidogrel (PLAVIX) 75 MG tablet take 1 tablet by mouth once daily WITH BREAKFAST 30 tablet 0  . famotidine (PEPCID) 20 MG tablet Take 20 mg by mouth as  needed for heartburn or indigestion.    . hydroxychloroquine (PLAQUENIL) 200 MG tablet Take 200 mg by mouth daily.     [name] losartan (COZAAR) 50 MG tablet Take 100 mg by mouth daily.     [name] oxyCODONE (ROXICODONE) 5 MG immediate release tablet Take 1 tablet (5 mg total) by mouth every 6 (six) hours as needed for severe pain. 20 tablet 0  . predniSONE (DELTASONE) 5 MG tablet Take 5 mg by mouth daily.     . clopidogrel (PLAVIX) 75 MG tablet take 1 tablet by mouth once daily WITH BREAKFAST 15 tablet 0   No facility-administered medications prior to visit.      Allergies:   Adhesive [tape]; Neurontin [gabapentin]; Penicillins; and Amitriptyline   Social History   Social History  . Marital status: Married    Spouse name: N/A  . Number of children: 2  . Years of education: N/A    Occupational History  . Disability    Social History Main Topics  . Smoking status: Never Smoker  . Smokeless tobacco: Never Used  . Alcohol use No  . Drug use: No  . Sexual activity: Not Asked   Other Topics Concern  . None   Social History Narrative  . None     Family History:  The patient's family history includes CAD in his father; Hypertension in his father and mother.   ROS:   Please see the history of present illness.    Review of Systems  Constitution: Negative.  HENT: Negative.   Cardiovascular: Positive for chest pain.  Respiratory: Negative.   Endocrine: Negative.   Hematologic/Lymphatic: Negative.   Musculoskeletal: Positive for arthritis, joint pain and joint swelling.       Severe deformities of his hands and feet from lupus  Gastrointestinal: Negative.   Genitourinary: Negative.   Neurological: Positive for loss of balance.   All other systems reviewed and are negative.   PHYSICAL EXAM:   VS:  BP 138/70 (BP Location: Right Arm, Patient Position: Sitting, Cuff Size: Normal)   Pulse 83   Ht 5\' 9"  (1.753 m)   Wt 123 lb (55.8 kg)   BMI 18.16 kg/m   Physical Exam  GEN: Well nourished, well developed, in no acute distress  Neck: no JVD, carotid bruits, or masses Cardiac:RRR; 0-0/9 harsh systolic murmur at the left sternal border into his carotids, 1/6 diastolic murmur at the left sternal border, 2/6 systolic murmur at the apex, positive S4 Respiratory:  clear to auscultation bilaterally, normal work of breathing GI: soft, nontender, nondistended, + BS Ext: without cyanosis, clubbing, or edema, Good distal pulses bilaterally MS: Significant contractures and deformities of his hands and feet from lupus  Skin: warm and dry, no rash Psych: euthymic mood, full affect  Wt Readings from Last 3 Encounters:  10/13/15 123 lb (55.8 kg)  09/02/14 131 lb (59.4 kg)  08/23/13 130 lb (59 kg)      Studies/Labs Reviewed:   EKG:  EKG is ordered today.  The ekg  ordered today demonstrates Normal sinus rhythm with LVH  Recent Labs: No results found for requested labs within last 8760 hours.   Lipid Panel    Component Value Date/Time   CHOL 108 09/11/2014 0944   TRIG 50.0 09/11/2014 0944   HDL 49.40 09/11/2014 0944   CHOLHDL 2 09/11/2014 0944   VLDL 10.0 09/11/2014 0944   LDLCALC 48 09/11/2014 0944    Additional studies/ records that were reviewed today include:  2-D echo 2014  Study Conclusions  - Left ventricle: The cavity size was normal. Wall thickness   was increased in a pattern of mild LVH with moderate basal   septal hypertrophy. Systolic function was normal. The   estimated ejection fraction was in the range of 55% to   60%. Wall motion was normal; there were no regional wall   motion abnormalities. Doppler parameters are consistent   with abnormal left ventricular relaxation (grade 1   diastolic dysfunction). Doppler parameters are consistent   with high ventricular filling pressure. - Aortic valve: Mildly calcified annulus. Trileaflet; mildly   thickened leaflets. Trivial regurgitation. - Mitral valve: Calcified annulus. Trivial regurgitation. - Left atrium: The atrium was mildly dilated. - Right atrium: Central venous pressure: 27mm Hg (est). - Atrial septum: No defect or patent foramen ovale was   identified. - Tricuspid valve: Trivial regurgitation. - Pulmonary arteries: Systolic pressure could not be   accurately estimated. - Pericardium, extracardiac: There was no pericardial   effusion. Impressions:  - No prior study for comparison. Mild LVH with moderate   basal septal hypertrophy and LVEF 55-60%. Grade 1   diastolic dysfunction with increased filling pressures.   Mild left atrial enlargement. MAC with trivial mitral   regurgitation. Mildly sclerotic aortic valve with trivial   aortic regurgitation. Unable to assess PASP. CVP estimated   in normal range. No pericardial effusion. Transthoracic echocardiography.   M-mode, complete 2D, spectral Doppler, and color Doppler.  Height:  Height: 177.8cm. Height: 70in.  Weight:  Weight: 61.7kg. Weight: 135.7lb.  Body mass index:  BMI: 19.5kg/m^2.  Body surface area:    BSA: 1.43m^2.  Blood pressure:     142/72.  Patient status:  Outpatient.  Location:  [facility]     ASSESSMENT:    1. Other chest pain   2. Coronary artery disease involving native coronary artery of native heart with angina pectoris ([place])   3. Murmur   4. End stage renal disease ([place])   5. Essential hypertension      PLAN:  In order of problems listed above: Chest pain at rest with increased frequency over the past 2 weeks. No exertional symptoms. With history of CAD will order Lexi scan Myoview to rule out ischemia  CAD with prior PTCA of the PDA 2014  Heart murmur consistent with AS/AI and MR. Check 2-D echo  End stage renal disease on hemodialysis  Essential hypertension controlled  Hyperlipidemia on Lipitor therapy profile just checked by his primary care in stable per patient       Medication Adjustments/Labs and Tests Ordered: Current medicines are reviewed at length with the patient today.  Concerns regarding medicines are outlined above.  Medication changes, Labs and Tests ordered today are listed in the Patient Instructions below. Patient Instructions  Medication Instructions:  Your physician recommends that you continue on your current medications as directed. Please refer to the Current Medication list given to you today.   Labwork: None  Testing/Procedures: Your physician has requested that you have an echocardiogram. Echocardiography is a painless test that uses sound waves to create images of your heart. It provides your doctor with information about the size and shape of your heart and how well your heart's chambers and valves are working. This procedure takes approximately one hour. There are no restrictions for this procedure.  Your physician  has requested that you have a lexiscan myoview. For further information please visit [email or web address]. Please follow instruction sheet, as given.    Follow-Up: Your  physician recommends that you schedule a follow-up appointment in: 4-6 weeks with Dr [name].        If you need a refill on your cardiac medications before your next appointment, please call your pharmacy.      Signed, Ermalinda Barrios, PA-C  10/13/2015 12:40 PM    [facility] [place], [place], [place]  [address] Phone: [phone]; Fax: [phone]

## 2015-10-13 NOTE — Patient Instructions (Signed)
Medication Instructions:  Your physician recommends that you continue on your current medications as directed. Please refer to the Current Medication list given to you today.   Labwork: None  Testing/Procedures: Your physician has requested that you have an echocardiogram. Echocardiography is a painless test that uses sound waves to create images of your heart. It provides your doctor with information about the size and shape of your heart and how well your heart's chambers and valves are working. This procedure takes approximately one hour. There are no restrictions for this procedure.  Your physician has requested that you have a lexiscan myoview. For further information please visit [email or web address]. Please follow instruction sheet, as given.    Follow-Up: Your physician recommends that you schedule a follow-up appointment in: 4-6 weeks with Dr [name].        If you need a refill on your cardiac medications before your next appointment, please call your pharmacy.

## 2015-10-14 ENCOUNTER — Other Ambulatory Visit: Payer: Self-pay | Admitting: *Deleted

## 2015-10-14 MED ORDER — CLOPIDOGREL BISULFATE 75 MG PO TABS: 75.0000 mg | ORAL_TABLET | Freq: Every day | ORAL | 11 refills | Status: DC

## 2015-11-12 ENCOUNTER — Telehealth: Payer: Self-pay | Admitting: Cardiovascular Disease

## 2015-11-12 ENCOUNTER — Telehealth (HOSPITAL_COMMUNITY): Payer: Self-pay | Admitting: *Deleted

## 2015-11-12 NOTE — Telephone Encounter (Signed)
Left message on voicemail per DPR in reference to upcoming appointment scheduled on 11/17/15 with detailed instructions given per Myocardial Perfusion Study Information Sheet for the test. LM to arrive 15 minutes early, and that it is imperative to arrive on time for appointment to keep from having the test rescheduled. If you need to cancel or reschedule your appointment, please call the office within 24 hours of your appointment. Failure to do so may result in a cancellation of your appointment, and a $50 no show fee. Phone number given for call back for any questions. Hubbard Robinson, RN

## 2015-11-12 NOTE — Telephone Encounter (Signed)
Pt requesting a call with instructions for stress test next Tuesday-pls call

## 2015-11-17 ENCOUNTER — Other Ambulatory Visit: Payer: Self-pay

## 2015-11-17 ENCOUNTER — Ambulatory Visit (HOSPITAL_BASED_OUTPATIENT_CLINIC_OR_DEPARTMENT_OTHER): Payer: Medicare Other

## 2015-11-17 ENCOUNTER — Ambulatory Visit (HOSPITAL_COMMUNITY): Payer: Medicare Other | Attending: Cardiovascular Disease

## 2015-11-17 ENCOUNTER — Encounter (INDEPENDENT_AMBULATORY_CARE_PROVIDER_SITE_OTHER): Payer: Self-pay

## 2015-11-17 DIAGNOSIS — R011 Cardiac murmur, unspecified: Secondary | ICD-10-CM | POA: Diagnosis not present

## 2015-11-17 DIAGNOSIS — R0789 Other chest pain: Secondary | ICD-10-CM | POA: Insufficient documentation

## 2015-11-17 LAB — MYOCARDIAL PERFUSION IMAGING
CHL CUP NUCLEAR SDS: 5
CHL CUP NUCLEAR SRS: 2
CHL CUP NUCLEAR SSS: 7
CSEPPHR: 92 {beats}/min
LV dias vol: 163 mL (ref 62–150)
LV sys vol: 73 mL
RATE: 0.34
Rest HR: 71 {beats}/min
TID: 1.01

## 2015-11-17 MED ORDER — TECHNETIUM TC 99M TETROFOSMIN IV KIT
11.0000 | PACK | Freq: Once | INTRAVENOUS | Status: AC | PRN
  Administered 2015-11-17: 11 via INTRAVENOUS
  Filled 2015-11-17: qty 11

## 2015-11-17 MED ORDER — TECHNETIUM TC 99M TETROFOSMIN IV KIT
32.2000 | PACK | Freq: Once | INTRAVENOUS | Status: AC | PRN
  Administered 2015-11-17: 32.2 via INTRAVENOUS
  Filled 2015-11-17: qty 33

## 2015-11-17 MED ORDER — REGADENOSON 0.4 MG/5ML IV SOLN
0.4000 mg | Freq: Once | INTRAVENOUS | Status: AC
  Administered 2015-11-17: 0.4 mg via INTRAVENOUS

## 2015-11-25 ENCOUNTER — Encounter: Payer: Self-pay | Admitting: Cardiology

## 2015-11-25 ENCOUNTER — Ambulatory Visit (INDEPENDENT_AMBULATORY_CARE_PROVIDER_SITE_OTHER): Payer: Medicare Other | Admitting: Cardiology

## 2015-11-25 ENCOUNTER — Encounter (INDEPENDENT_AMBULATORY_CARE_PROVIDER_SITE_OTHER): Payer: Self-pay

## 2015-11-25 VITALS — BP 108/60 | HR 76 | Ht 69.0 in | Wt 123.0 lb

## 2015-11-25 DIAGNOSIS — I2510 Atherosclerotic heart disease of native coronary artery without angina pectoris: Secondary | ICD-10-CM

## 2015-11-25 NOTE — Patient Instructions (Signed)
Medication Instructions:  Your physician recommends that you continue on your current medications as directed. Please refer to the Current Medication list given to you today.  Labwork: None  Testing/Procedures: None  Follow-Up: Your physician wants you to follow-up in: 6 Months with Dr. Shirley Friar will receive a reminder letter in the mail two months in advance. If you don't receive a letter, please call our office to schedule the follow-up appointment.   Any Other Special Instructions Will Be Listed Below (If Applicable).     If you need a refill on your cardiac medications before your next appointment, please call your pharmacy.  Thank you for choosing [facility]!

## 2015-11-25 NOTE — Progress Notes (Signed)
11/25/2015 Stanley Stewart   07/28/1955  [id number]  Primary Physician [name], MD Primary Cardiologist: Dr. [name]    Reason for Visit/CC: F/u for Chest Pain/ CAD  HPI:  The patient is a 60 y/o male, followed by Dr. [name], with a h/o CAD s/p PTCA only of a 995 occluded RPDA in Dec. 2014. He was also noted to have a normal LM, 20% mid LAD stenosis, normal LCx and 30% distal RCA. EF was normal at 55%. He also has a h/o ESRDon HD, HTN, Lupus, vertigo and anemia. He was recently seen by Estella Husk, PA-C, on 10/13/2015 and complained of recurrent chest pain. He was instructed to undergo a NST. This was performed on 11/17/15. There was a medium defect of severe severity present in the mid inferior, apical inferior and apex location. Findings were consistent with prior myocardial infarction with peri-infarct ischemia. Results were interpreted a EF normal at 55-65%. He also had a 2D echo which confirmed normal LVF at 50-55%, normal wall motion, grade 2DD, mild AS and mild AI. Dr. [name] reviewed his stress test findings. Per results note, Dr. [name] outlined, "If his symptoms are concerning, he probably needs to be seen back to discuss cath. His nuclear study is abnormal but can't say for sure that is at all related to his resting symptoms".  He presents back to clinic for f/u to reassess symptoms and determine need for cath.   He reports that he has done well. No recurrent CP. He was out in his yard raking leaves the other day and had no exertional chest pain.   Current Meds  Medication Sig  . amLODipine (NORVASC) 5 MG tablet Take 1 tablet (5 mg total) by mouth daily.  [name] atorvastatin (LIPITOR) 40 MG tablet Take 1 tablet (40 mg total) by mouth daily at 6 PM. Please call and schedule an appointment  . cinacalcet (SENSIPAR) 30 MG tablet Take 30 mg by mouth daily with breakfast.   . clopidogrel (PLAVIX) 75 MG tablet Take 1 tablet (75 mg total) by mouth daily.  . famotidine  (PEPCID) 20 MG tablet Take 20 mg by mouth as needed for heartburn or indigestion.  [name] FOSRENOL 1000 MG chewable tablet Chew 1,000 mg by mouth 3 (three) times daily with meals.  . hydroxychloroquine (PLAQUENIL) 200 MG tablet Take 200 mg by mouth daily.   . predniSONE (DELTASONE) 5 MG tablet Take 5 mg by mouth daily.   . [DISCONTINUED] losartan (COZAAR) 50 MG tablet Take 100 mg by mouth daily.    Allergies  Allergen Reactions  . Adhesive [Tape]     Pulls skin off,  ONLY use paper tape.   [name] Penicillins Other (See Comments)    Patient states that MD told him he was allergic to this med but he does not recall having any reactions   . Amitriptyline Rash   Past Medical History:  Diagnosis Date  . Anemia   . Arthritis   . Coronary artery disease    a. 12/2012 Cath/PTCA: LM nl, LAD 59m, LCX nl, RCA 30d, RPDA 99 (PTCA only - 1.5-81mm vessel), EF 55%, subtle apical HK.  [name] ESRD (end stage renal disease) ([place])    Patient started dialysis around 2010.  Pt states the cause of ESRD was lupus and/or HBP.  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 Gets hemodialysis at [facility]) on MWF schedule using L arm AVF.    [name]  Herpes zoster   . Hypertension   . Iritis   . Lupus 2002  . 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 Peripheral neuropathy ([place])   . Pseudoaneurysm ([place])    Enlarged Left   FA    AVF  . Vertigo    Family History  Problem Relation Age of Onset  . Hypertension Mother   . CAD Father   . Hypertension Father    Past Surgical History:  Procedure Laterality Date  . AV FISTULA PLACEMENT  05/28/2006   LEFT forearm AVF  . CARDIAC CATHETERIZATION  12/20/2012  . COLONOSCOPY    . CORONARY ANGIOPLASTY  12/20/2012  . FISTULA SUPERFICIALIZATION Left 9/74/1638   Procedure: PLICATION OF RADIOCEPHALIC ARTERIOVENOUS FISTULA;  Surgeon: Conrad Belleville, MD;  Location: [place];  Service: Vascular;  Laterality: Left;  . FRACTURE  SURGERY  Oct. 2009   Right Hip  . LEFT HEART CATHETERIZATION WITH CORONARY ANGIOGRAM N/A 12/20/2012   Procedure: LEFT HEART CATHETERIZATION WITH CORONARY ANGIOGRAM;  Surgeon: Burnell Blanks, MD;  Location: [facility] CATH LAB;  Service: Cardiovascular;  Laterality: N/A;  . lymph node biopsy right arm    . VASECTOMY     Social History   Social History  . Marital status: Married    Spouse name: N/A  . Number of children: 2  . Years of education: N/A   Occupational History  . Disability    Social History Main Topics  . Smoking status: Never Smoker  . Smokeless tobacco: Never Used  . Alcohol use No  . Drug use: No  . Sexual activity: Not on file   Other Topics Concern  . Not on file   Social History Narrative  . No narrative on file     Review of Systems: General: negative for chills, fever, night sweats or weight changes.  Cardiovascular: negative for chest pain, dyspnea on exertion, edema, orthopnea, palpitations, paroxysmal nocturnal dyspnea or shortness of breath Dermatological: negative for rash Respiratory: negative for cough or wheezing Urologic: negative for hematuria Abdominal: negative for nausea, vomiting, diarrhea, bright red blood per rectum, melena, or hematemesis Neurologic: negative for visual changes, syncope, or dizziness All other systems reviewed and are otherwise negative except as noted above.   Physical Exam:  Blood pressure 108/60, pulse 76, height 5\' 9"  (1.753 m), weight 123 lb (55.8 kg).  General appearance: alert, cooperative and no distress Neck: no carotid bruit and no JVD Lungs: clear to auscultation bilaterally Heart: regular rate and rhythm, S1, S2 normal, 2/6 AS/AI murmur no click, rub or gallop Extremities: severe arthritis bilateral hands Pulses: 2+ and symmetric Skin: Skin color, texture, turgor normal. No rashes or lesions Neurologic: Grossly normal  EKG not performed   ASSESSMENT AND PLAN:   1. CAD: s/p PTCA of RPDA in 2014  with residual mild nonobstructive disease in the mid LAD and distal RCA.  Recent NST showed a medium defect of severe severity present in the mid inferior, apical inferior and apex location. Findings were consistent with prior myocardial infarction with peri-infarct ischemia. Results were interpreted as low risk. EF normal at 55-65%. He also had a 2D echo which confirmed normal LVF at 50-55% and normal wall motion. He has not had any recurrent CP. He was able to perform yard work 2 days ago (raking leaves) and had no exertional symptoms. We will continue medical therapy for now. No indication for cath at this time. Continue Plavix, statin, CCB and ARB. He will notify us if any  recurrence of symptoms.   2. Aortic Valve Disease: mild AS/AI noted on recent echo. EF normal. Continue to monitor.  3. ESRD: on HD  4. HTN: BP is well controlled today.   5. HLD: on statin therapy with Lipitor. Lipid profile followed by PCP.   PLAN  F/u with Dr. [name] in 6 months  Stanley Stewart 11/25/2015 3:02 PM

## 2015-12-08 ENCOUNTER — Other Ambulatory Visit: Payer: Self-pay | Admitting: Cardiovascular Disease

## 2016-09-13 ENCOUNTER — Other Ambulatory Visit: Payer: Self-pay | Admitting: Cardiovascular Disease

## 2016-10-13 ENCOUNTER — Other Ambulatory Visit: Payer: Self-pay | Admitting: Cardiovascular Disease

## 2016-11-18 ENCOUNTER — Other Ambulatory Visit: Payer: Self-pay | Admitting: Cardiovascular Disease

## 2016-11-18 MED ORDER — CLOPIDOGREL BISULFATE 75 MG PO TABS: 75.0000 mg | ORAL_TABLET | Freq: Every day | ORAL | 0 refills | Status: DC

## 2016-12-09 ENCOUNTER — Other Ambulatory Visit: Payer: Self-pay | Admitting: Physician Assistant

## 2016-12-09 MED ORDER — CLOPIDOGREL BISULFATE 75 MG PO TABS: 75.0000 mg | ORAL_TABLET | Freq: Every day | ORAL | 0 refills | Status: DC

## 2016-12-29 ENCOUNTER — Other Ambulatory Visit: Payer: Self-pay | Admitting: Cardiovascular Disease

## 2016-12-29 MED ORDER — ATORVASTATIN CALCIUM 40 MG PO TABS: 40.0000 mg | ORAL_TABLET | Freq: Every day | ORAL | 0 refills | Status: DC

## 2017-01-04 ENCOUNTER — Ambulatory Visit: Payer: Medicare Other | Admitting: Cardiology

## 2017-01-04 ENCOUNTER — Encounter: Payer: Self-pay | Admitting: Cardiology

## 2017-01-04 VITALS — BP 136/56 | HR 70 | Ht 69.0 in | Wt 118.8 lb

## 2017-01-04 DIAGNOSIS — I10 Essential (primary) hypertension: Secondary | ICD-10-CM | POA: Diagnosis not present

## 2017-01-04 DIAGNOSIS — I2510 Atherosclerotic heart disease of native coronary artery without angina pectoris: Secondary | ICD-10-CM

## 2017-01-04 MED ORDER — CLOPIDOGREL BISULFATE 75 MG PO TABS: 75.0000 mg | ORAL_TABLET | Freq: Every day | ORAL | 3 refills | Status: DC

## 2017-01-04 MED ORDER — AMLODIPINE BESYLATE 5 MG PO TABS: 5.0000 mg | ORAL_TABLET | Freq: Every day | ORAL | 3 refills | Status: DC

## 2017-01-04 MED ORDER — LOSARTAN POTASSIUM 100 MG PO TABS: 100.0000 mg | ORAL_TABLET | Freq: Every day | ORAL | 3 refills | Status: DC

## 2017-01-04 NOTE — Progress Notes (Signed)
01/04/2017 Stanley Stewart   [birth year]  [id number]  Primary Physician [name], MD Primary Cardiologist: Dr. [name]   Reason for Visit/CC: 1 year f/u for CAD; medication refills  HPI:  Stanley Stewart is a 62 y.o. male who is being seen today for routine f/u given h/o CAD. He is followed by Dr. [name] and presents for 1 year f/u. He has a h/o PTCA tot the RPDA in 2014. EF was normal at 55%. He also has a h/o ESRD on HD and lupus. He was seen 10/2015. He noted some atypical CP (resting pain, non exertional) and was ordered to get a NST. The study showed a medium defect of severe severity present in the mid inferior, apical inferior and apex location. Findings were consistent with prior myocardial infarction with peri-infarct ischemia. Results were interpreted as low risk. EF normal at 55-65%. He also had a 2D echo which confirmed normal LVF at 50-55% and normal wall motion as well as mild AS/AI. His stress test was reviewed by Dr. [name] and he was advised to f/u to discuss if cath would be needed. Pt was seen back on 11/25/15 and had noted resolution of symptoms. He denied any exertional CP. He had returned to yard work (raking leaves) and noted no difficulty. We elected to continue medical therapy.  He has done well since. He denies any recurrent symptoms. He is mainly here today for check up and for medication refills. He is doing well. He denies any CP or dyspnea. He has joined the gym has has been going for several months. He does cardio several days a week and denies exertional symptoms. EKG shows NSR. BP is well controlled. He has no issues with hypotension during HD. His PCP follows his lipid profile. Labs reviewed from [facility]. Last lipid panel 05/2016 showed controlled LDL at 63 mg/dL.     Current Meds  Medication Sig  . amLODipine (NORVASC) 5 MG tablet Take 1 tablet (5 mg total) by mouth daily.  [name] atorvastatin (LIPITOR) 40 MG tablet Take 40 mg by mouth daily.  .  cinacalcet (SENSIPAR) 30 MG tablet Take 30 mg by mouth daily with breakfast.   . clopidogrel (PLAVIX) 75 MG tablet Take 1 tablet (75 mg total) by mouth daily.  . famotidine (PEPCID) 20 MG tablet Take 20 mg by mouth as needed for heartburn or indigestion.  [name] FOSRENOL 1000 MG chewable tablet Chew 1,000 mg by mouth 3 (three) times daily with meals.  . hydroxychloroquine (PLAQUENIL) 200 MG tablet Take 200 mg by mouth daily.   [name] losartan (COZAAR) 100 MG tablet Take 1 tablet (100 mg total) by mouth daily.  . predniSONE (DELTASONE) 5 MG tablet Take 5 mg by mouth daily.   . [DISCONTINUED] amLODipine (NORVASC) 5 MG tablet Take 1 tablet (5 mg total) by mouth daily.  . [DISCONTINUED] clopidogrel (PLAVIX) 75 MG tablet Take 75 mg by mouth daily.  . [DISCONTINUED] losartan (COZAAR) 100 MG tablet take 1 tablet by mouth once daily   Allergies  Allergen Reactions  . Adhesive [Tape]     Pulls skin off,  ONLY use paper tape.   [name] Penicillins Other (See Comments)    Patient states that MD told him he was allergic to this med but he does not recall having any reactions   . Amitriptyline Rash   Past Medical History:  Diagnosis Date  . Anemia   . Arthritis   . Coronary artery disease    a. 12/2012 Cath/PTCA: LM  nl, LAD 19m, LCX nl, RCA 30d, RPDA 99 (PTCA only - 1.5-76mm vessel), EF 55%, subtle apical HK.  [name] ESRD (end stage renal disease) ([place])    Patient started dialysis around 2010.  Pt states the cause of ESRD was lupus and/or HBP.  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 Gets hemodialysis at [facility]) on MWF schedule using L arm AVF.    [name] Herpes zoster   . Hypertension   . Iritis   . Lupus 2002  . 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 Peripheral neuropathy   . Pseudoaneurysm ([place])    Enlarged Left   FA    AVF  . Vertigo    Family History  Problem Relation Age of Onset  . Hypertension Mother   . CAD Father   .  Hypertension Father    Past Surgical History:  Procedure Laterality Date  . AV FISTULA PLACEMENT  05/28/2006   LEFT forearm AVF  . CARDIAC CATHETERIZATION  12/20/2012  . COLONOSCOPY    . CORONARY ANGIOPLASTY  12/20/2012  . FISTULA SUPERFICIALIZATION Left 07/03/1599   Procedure: PLICATION OF RADIOCEPHALIC ARTERIOVENOUS FISTULA;  Surgeon: Conrad Marshallton, MD;  Location: [place];  Service: Vascular;  Laterality: Left;  . FRACTURE SURGERY  Oct. 2009   Right Hip  . LEFT HEART CATHETERIZATION WITH CORONARY ANGIOGRAM N/A 12/20/2012   Procedure: LEFT HEART CATHETERIZATION WITH CORONARY ANGIOGRAM;  Surgeon: Burnell Blanks, MD;  Location: [facility] CATH LAB;  Service: Cardiovascular;  Laterality: N/A;  . lymph node biopsy right arm    . VASECTOMY     Social History   Socioeconomic History  . Marital status: Married    Spouse name: Not on file  . Number of children: 2  . Years of education: Not on file  . Highest education level: Not on file  Social Needs  . Financial resource strain: Not on file  . Food insecurity - worry: Not on file  . Food insecurity - inability: Not on file  . Transportation needs - medical: Not on file  . Transportation needs - non-medical: Not on file  Occupational History  . Occupation: Disability  Tobacco Use  . Smoking status: Never Smoker  . Smokeless tobacco: Never Used  Substance and Sexual Activity  . Alcohol use: No  . Drug use: No  . Sexual activity: Not on file  Other Topics Concern  . Not on file  Social History Narrative  . Not on file     Review of Systems: General: negative for chills, fever, night sweats or weight changes.  Cardiovascular: negative for chest pain, dyspnea on exertion, edema, orthopnea, palpitations, paroxysmal nocturnal dyspnea or shortness of breath Dermatological: negative for rash Respiratory: negative for cough or wheezing Urologic: negative for hematuria Abdominal: negative for nausea, vomiting, diarrhea, bright red  blood per rectum, melena, or hematemesis Neurologic: negative for visual changes, syncope, or dizziness All other systems reviewed and are otherwise negative except as noted above.   Physical Exam:  Blood pressure (!) 136/56, pulse 70, height 5\' 9"  (1.753 m), weight 118 lb 12.8 oz (53.9 kg), SpO2 99 %.  General appearance: alert, cooperative and no distress Neck: no carotid bruit and no JVD Lungs: clear to auscultation bilaterally Heart: regular rate and rhythm, 2/6 SM at RUSB Extremities: extremities normal, atraumatic, no cyanosis or edema Pulses: 2+ and symmetric Skin: Skin color, texture, turgor normal. No rashes or  lesions Neurologic: Grossly normal  EKG NSR  -- personally reviewed   ASSESSMENT AND PLAN:   1. CAD: s/p PTCA of RPDA in 2014 with residual mild nonobstructive disease in the mid LAD and distal RCA. NST 11/2015 showed  a medium defect of severe severity present in the mid inferior, apical inferior and apex location. Findings were consistent with prior myocardial infarction with peri-infarct ischemia. Results were interpreted as low risk. EF normal at 55-65%. However he had no exertional symptoms and medical therapy was elected. He has done well since. No anginal symptoms. No exertional symptoms. Continue medical therapy.   2. Aortic Stenosis: echo 11/2015 showed mild AS w/ mild AI. He denies CP, dyspnea, syncope/ near syncope. Consider repeat echo next year.   3. ESRD: on HD.   4. HTN: controlled on current regimen.   5. HLD: on statin therapy. Lipids followed by PCP and has been at goal, 63 mg/dL. Continue Lipitor.   Follow-Up in 1 year   Atiyana Welte Ladoris Gene, MHS [facility] 01/04/2017 2:05 PM

## 2017-01-04 NOTE — Patient Instructions (Signed)
Medication Instructions:  Your physician recommends that you continue on your current medications as directed. Please refer to the Current Medication list given to you today.   Labwork: None Ordered   Testing/Procedures: None Ordered   Follow-Up: Your physician wants you to follow-up in: 1 year with Dr. [name]. You will receive a reminder letter in the mail two months in advance. If you don't receive a letter, please call our office to schedule the follow-up appointment.   If you need a refill on your cardiac medications before your next appointment, please call your pharmacy.   Thank you for choosing [facility]! Christen Bame, RN [phone]

## 2017-04-06 ENCOUNTER — Other Ambulatory Visit: Payer: Self-pay | Admitting: Cardiovascular Disease

## 2017-07-10 ENCOUNTER — Other Ambulatory Visit: Payer: Self-pay | Admitting: Internal Medicine

## 2017-07-10 DIAGNOSIS — N281 Cyst of kidney, acquired: Secondary | ICD-10-CM

## 2017-07-18 ENCOUNTER — Other Ambulatory Visit: Payer: Medicare Other

## 2017-10-17 ENCOUNTER — Ambulatory Visit
Admission: RE | Admit: 2017-10-17 | Discharge: 2017-10-17 | Disposition: A | Discharge status: Home / Self Care | Payer: Medicare Other | Source: Ambulatory Visit | Attending: Internal Medicine | Admitting: Internal Medicine

## 2017-10-17 ENCOUNTER — Other Ambulatory Visit: Payer: Self-pay | Admitting: Internal Medicine

## 2017-10-17 DIAGNOSIS — N281 Cyst of kidney, acquired: Secondary | ICD-10-CM

## 2018-01-10 ENCOUNTER — Telehealth: Payer: Self-pay | Admitting: Cardiovascular Disease

## 2018-01-10 NOTE — Telephone Encounter (Signed)
New Message   Patient c/o Palpitations:  High priority if patient c/o lightheadedness, shortness of breath, or chest pain  1) How long have you had palpitations/irregular HR/ Afib? Are you having the symptoms now? Just today on and off at dialysis   2) Are you currently experiencing lightheadedness, SOB or CP? No   3) Do you have a history of afib (atrial fibrillation) or irregular heart rhythm?  No   4) Have you checked your BP or HR? (document readings if available): HR was at 80 at dialysis  5) Are you experiencing any other symptoms? None  Patient states that today at dialysis he felt flutters in his chest. He stated that the nurse could hear it but his HR was in the 80's. Please call to discuss.

## 2018-01-10 NOTE — Telephone Encounter (Signed)
Called patient back about his message. Patient stated he had a "funny flutter" in chest while at dialysis. Patient stated the nurse listen to his heart and it was irregular and his HR was in the 80's.  Patient stated it continued until he was done with dialysis and stop shortly after. Patient stated he is feeling fine now. Made patient an appointment with Cecilie Kicks NP to get evaluated. Informed patient they might have him wear a monitor to determine how often he is having an irregular rhythm. Informed patient that it might have been a one time occurrence, since he stated his BP dropped during dialysis. Patient has no history of A. Fib. Patient stated he would come for his appt this Friday to get evaluated after he has dialysis.

## 2018-01-11 ENCOUNTER — Other Ambulatory Visit: Payer: Self-pay | Admitting: Cardiology

## 2018-01-11 ENCOUNTER — Other Ambulatory Visit: Payer: Self-pay | Admitting: Cardiovascular Disease

## 2018-01-11 NOTE — Progress Notes (Signed)
Cardiology Office Note   Date:  01/13/2018   ID:  Stanley Stewart, DOB 11/30/1955, MRN [id number]  PCP:  [name], MD  Cardiologist:  Dr, [name]     Chief Complaint  Patient presents with  . Chest Pain      History of Present Illness: Stanley Stewart is a 63 y.o. male who presents for pain in chest   He has a h/o PTCA tot the RPDA in 2014. EF was normal at 55%. He also has a h/o ESRD on HD and lupus. He was seen 10/2015. He noted some atypical CP (resting pain, non exertional) and was ordered to get a NST. The study showed a medium defect of severe severity present in the mid inferior, apical inferior and apex location. Findingswereconsistent with prior myocardial infarction with peri-infarct ischemia. Results were interpreted as low risk. EF normal at 55-65%. He also had a 2D echo which confirmed normal LVF at 50-55% and normal wall motion as well as mild AS/AI. His stress test was reviewed by Dr. [name] and he was advised to f/u to discuss if cath would be needed. Pt was seen back on 11/25/15 and had noted resolution of symptoms. He denied any exertional CP. He had returned to yard work (raking leaves) and noted no difficulty. We elected to continue medical therapy.  Remote cath 2014 with small PDA disease with PTCA.  CTA of abd in 2017 with calcification in RCA and LCX.    He has been worked up at [facility] for renal transplant.  He had nuc study 06/2017 that was the same as 2017.    He has ESRD on HD. MWF, on Wed he developed chest discomfort epigastric.  Lasted an hour, he noted his BP was stable.  He was not connected to a monitor.  Since that time he has been to gym and lifted mild weights and no pain at all and no SOB.  He stated he did have burping afterwards which he thought helped relieve the discomfort.  EKG is stable.      He is for bilateral nephrectomy on 02/01/17 though recent call from [facility] cancel.   Past Medical History:  Diagnosis Date  .  Anemia   . Arthritis   . Coronary artery disease    a. 12/2012 Cath/PTCA: LM nl, LAD 73m, LCX nl, RCA 30d, RPDA 99 (PTCA only - 1.5-62mm vessel), EF 55%, subtle apical HK.  [name] ESRD (end stage renal disease) ([place])    Patient started dialysis around 2010.  Pt states the cause of ESRD was lupus and/or HBP.  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 Gets hemodialysis at [facility]) on MWF schedule using L arm AVF.    [name] Herpes zoster   . Hypertension   . Iritis   . Lupus ([place]) 2002  . 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 Peripheral neuropathy   . Pseudoaneurysm ([place])    Enlarged Left   FA    AVF  . Vertigo     Past Surgical History:  Procedure Laterality Date  . AV FISTULA PLACEMENT  05/28/2006   LEFT forearm AVF  . CARDIAC CATHETERIZATION  12/20/2012  . COLONOSCOPY    . CORONARY ANGIOPLASTY  12/20/2012  . FISTULA SUPERFICIALIZATION Left 6/44/0347   Procedure: PLICATION OF RADIOCEPHALIC ARTERIOVENOUS FISTULA;  Surgeon: Conrad Fairmount, MD;  Location: [place];  Service: Vascular;  Laterality: Left;  . FRACTURE SURGERY  Oct. 2009   Right Hip  . LEFT HEART CATHETERIZATION WITH CORONARY ANGIOGRAM N/A 12/20/2012   Procedure: LEFT HEART CATHETERIZATION WITH CORONARY ANGIOGRAM;  Surgeon: Burnell Blanks, MD;  Location: [facility] CATH LAB;  Service: Cardiovascular;  Laterality: N/A;  . lymph node biopsy right arm    . VASECTOMY       Current Outpatient Medications  Medication Sig Dispense Refill  . amLODipine (NORVASC) 5 MG tablet Take 1 tablet (5 mg total) by mouth daily. 90 tablet 3  . atorvastatin (LIPITOR) 40 MG tablet Take 1 tablet (40 mg total) by mouth daily at 6 PM. Please keep upcoming appt for future refills. 30 tablet 0  . clopidogrel (PLAVIX) 75 MG tablet TAKE 1 TABLET BY MOUTH ONCE DAILY 90 tablet 0  . famotidine (PEPCID) 20 MG tablet Take 20 mg by mouth as needed for heartburn or indigestion.    [name]  FOSRENOL 1000 MG chewable tablet Chew 1,000 mg by mouth 3 (three) times daily with meals.  0  . hydroxychloroquine (PLAQUENIL) 200 MG tablet Take 200 mg by mouth daily.     . predniSONE (DELTASONE) 5 MG tablet Take 5 mg by mouth daily.      No current facility-administered medications for this visit.     Allergies:   Adhesive [tape]; Penicillins; and Amitriptyline    Social History:  The patient  reports that he has never smoked. He has never used smokeless tobacco. He reports that he does not drink alcohol or use drugs.   Family History:  The patient's family history includes CAD in his father; Hypertension in his father and mother.    ROS:  General:no colds or fevers, no weight changes Skin:no rashes or ulcers HEENT:no blurred vision, no congestion CV:see HPI PUL:see HPI GI:no diarrhea constipation or melena, no indigestion GU:no hematuria, no dysuria MS:no joint pain, no claudication Neuro:no syncope, no lightheadedness Endo:no diabetes, no thyroid disease Renal:  HD MWF   Wt Readings from Last 3 Encounters:  01/12/18 126 lb 1.9 oz (57.2 kg)  01/04/17 118 lb 12.8 oz (53.9 kg)  11/25/15 123 lb (55.8 kg)     PHYSICAL EXAM: VS:  BP 116/64   Pulse 77   Ht 5\' 9"  (1.753 m)   Wt 126 lb 1.9 oz (57.2 kg)   SpO2 96%   BMI 18.62 kg/m  , BMI Body mass index is 18.62 kg/m. General:Pleasant affect, NAD Skin:Warm and dry, brisk capillary refill HEENT:normocephalic, sclera clear, mucus membranes moist Neck:supple, no JVD, no bruits  Heart:S1S2 RRR without murmur, gallup, rub or click Lungs:clear without rales, rhonchi, or wheezes WLN:LGXQ, non tender, + BS, do not palpate liver spleen or masses Ext:no lower ext edema, 2+ pedal pulses, 2+ radial pulses. AV graft with thrill, fingers with deformity from lupus Neuro:alert and oriented X 3, MAE, follows commands, + facial symmetry    EKG:  EKG is ordered today. The ekg ordered today demonstrates SR LVH, non specific T wave  abnormality no acute changes from prior     Recent Labs: No results found for requested labs within last 8760 hours.    Lipid Panel    Component Value Date/Time   CHOL 108 09/11/2014 0944   TRIG 50.0 09/11/2014 0944   HDL 49.40 09/11/2014 0944   CHOLHDL 2 09/11/2014 0944   VLDL 10.0 09/11/2014 0944   LDLCALC 48 09/11/2014 0944       Other studies Reviewed: Additional studies/ records that were reviewed  today include: .  See above for nuc studies.   ASSESSMENT AND PLAN:  1.  Epigastric discomfort with drop in BP occurring with dialysis - it is reassuring that he was able to exercise without problems the next day and he had no issues with dialysis today,  Would only monitor.  With possible surgery in near future I discussed with Dr. Caryl Comes DOD and will refer back to cardiology at [facility] in [facility] office.  On Plavix not ASA.  Follow up with Dr. [name] in 4 months.  2.  CAD with prior PTCA to small PDA in 2014.    3.  ESRD on HD MWF. Worked up for renal transplant.  4.  HLD on statin   Current medicines are reviewed with the patient today.  The patient Has no concerns regarding medicines.  The following changes have been made:  See above Labs/ tests ordered today include:see above  Disposition:   FU:  see above  Signed, Cecilie Kicks, NP  01/13/2018 10:22 PM    [facility] [place], [place] [address] [place], [place] Phone: [phone]; Fax: [phone]

## 2018-01-12 ENCOUNTER — Encounter: Payer: Self-pay | Admitting: *Deleted

## 2018-01-12 ENCOUNTER — Ambulatory Visit: Payer: Medicare Other | Admitting: Cardiology

## 2018-01-12 ENCOUNTER — Encounter: Payer: Self-pay | Admitting: Cardiology

## 2018-01-12 VITALS — BP 116/64 | HR 77 | Ht 69.0 in | Wt 126.1 lb

## 2018-01-12 DIAGNOSIS — R079 Chest pain, unspecified: Secondary | ICD-10-CM | POA: Diagnosis not present

## 2018-01-12 DIAGNOSIS — I2510 Atherosclerotic heart disease of native coronary artery without angina pectoris: Secondary | ICD-10-CM | POA: Diagnosis not present

## 2018-01-12 DIAGNOSIS — I10 Essential (primary) hypertension: Secondary | ICD-10-CM | POA: Diagnosis not present

## 2018-01-12 DIAGNOSIS — N186 End stage renal disease: Secondary | ICD-10-CM | POA: Diagnosis not present

## 2018-01-12 DIAGNOSIS — Z992 Dependence on renal dialysis: Secondary | ICD-10-CM

## 2018-01-12 NOTE — Patient Instructions (Addendum)
Your physician recommends that you continue on your current medications as directed. Please refer to the Current Medication list given to you today.  Your physician recommends that you schedule a follow-up appointment in:  [facility] [phone] [place]

## 2018-01-13 ENCOUNTER — Encounter: Payer: Self-pay | Admitting: Cardiology

## 2018-02-02 ENCOUNTER — Other Ambulatory Visit: Payer: Self-pay | Admitting: Cardiology

## 2018-02-02 DIAGNOSIS — I10 Essential (primary) hypertension: Secondary | ICD-10-CM

## 2018-03-01 ENCOUNTER — Other Ambulatory Visit: Payer: Self-pay

## 2018-03-01 MED ORDER — ATORVASTATIN CALCIUM 40 MG PO TABS: 40.0000 mg | ORAL_TABLET | Freq: Every day | ORAL | 3 refills | Status: AC

## 2018-03-22 ENCOUNTER — Encounter (HOSPITAL_COMMUNITY): Payer: Self-pay | Admitting: Nephrology

## 2018-03-22 ENCOUNTER — Other Ambulatory Visit: Payer: Self-pay

## 2018-03-22 ENCOUNTER — Inpatient Hospital Stay (HOSPITAL_COMMUNITY)
Admission: AD | Admit: 2018-03-22 | Discharge: 2018-03-25 | DRG: 377 | Disposition: A | Discharge status: Home / Self Care | Payer: Medicare Other | Attending: Internal Medicine | Admitting: Internal Medicine

## 2018-03-22 ENCOUNTER — Emergency Department (HOSPITAL_COMMUNITY): Admission: EM | Admit: 2018-03-22 | Payer: Medicare Other | Source: Home / Self Care

## 2018-03-22 DIAGNOSIS — Z8249 Family history of ischemic heart disease and other diseases of the circulatory system: Secondary | ICD-10-CM

## 2018-03-22 DIAGNOSIS — Z9861 Coronary angioplasty status: Secondary | ICD-10-CM | POA: Diagnosis not present

## 2018-03-22 DIAGNOSIS — R160 Hepatomegaly, not elsewhere classified: Secondary | ICD-10-CM | POA: Diagnosis present

## 2018-03-22 DIAGNOSIS — I2583 Coronary atherosclerosis due to lipid rich plaque: Secondary | ICD-10-CM | POA: Diagnosis not present

## 2018-03-22 DIAGNOSIS — I120 Hypertensive chronic kidney disease with stage 5 chronic kidney disease or end stage renal disease: Secondary | ICD-10-CM | POA: Diagnosis present

## 2018-03-22 DIAGNOSIS — K08109 Complete loss of teeth, unspecified cause, unspecified class: Secondary | ICD-10-CM | POA: Diagnosis present

## 2018-03-22 DIAGNOSIS — K228 Other specified diseases of esophagus: Secondary | ICD-10-CM | POA: Diagnosis present

## 2018-03-22 DIAGNOSIS — K5730 Diverticulosis of large intestine without perforation or abscess without bleeding: Secondary | ICD-10-CM | POA: Diagnosis present

## 2018-03-22 DIAGNOSIS — K2971 Gastritis, unspecified, with bleeding: Secondary | ICD-10-CM | POA: Diagnosis present

## 2018-03-22 DIAGNOSIS — D62 Acute posthemorrhagic anemia: Secondary | ICD-10-CM | POA: Diagnosis present

## 2018-03-22 DIAGNOSIS — K648 Other hemorrhoids: Secondary | ICD-10-CM | POA: Diagnosis present

## 2018-03-22 DIAGNOSIS — I350 Nonrheumatic aortic (valve) stenosis: Secondary | ICD-10-CM | POA: Diagnosis present

## 2018-03-22 DIAGNOSIS — IMO0002 Reserved for concepts with insufficient information to code with codable children: Secondary | ICD-10-CM | POA: Diagnosis present

## 2018-03-22 DIAGNOSIS — G629 Polyneuropathy, unspecified: Secondary | ICD-10-CM | POA: Diagnosis present

## 2018-03-22 DIAGNOSIS — K274 Chronic or unspecified peptic ulcer, site unspecified, with hemorrhage: Secondary | ICD-10-CM | POA: Diagnosis not present

## 2018-03-22 DIAGNOSIS — D649 Anemia, unspecified: Secondary | ICD-10-CM | POA: Diagnosis present

## 2018-03-22 DIAGNOSIS — Q613 Polycystic kidney, unspecified: Secondary | ICD-10-CM | POA: Diagnosis not present

## 2018-03-22 DIAGNOSIS — Z7952 Long term (current) use of systemic steroids: Secondary | ICD-10-CM

## 2018-03-22 DIAGNOSIS — I10 Essential (primary) hypertension: Secondary | ICD-10-CM | POA: Diagnosis present

## 2018-03-22 DIAGNOSIS — N2581 Secondary hyperparathyroidism of renal origin: Secondary | ICD-10-CM | POA: Diagnosis present

## 2018-03-22 DIAGNOSIS — M1990 Unspecified osteoarthritis, unspecified site: Secondary | ICD-10-CM | POA: Diagnosis present

## 2018-03-22 DIAGNOSIS — K319 Disease of stomach and duodenum, unspecified: Secondary | ICD-10-CM | POA: Diagnosis present

## 2018-03-22 DIAGNOSIS — K5521 Angiodysplasia of colon with hemorrhage: Principal | ICD-10-CM | POA: Diagnosis present

## 2018-03-22 DIAGNOSIS — M898X9 Other specified disorders of bone, unspecified site: Secondary | ICD-10-CM | POA: Diagnosis present

## 2018-03-22 DIAGNOSIS — M329 Systemic lupus erythematosus, unspecified: Secondary | ICD-10-CM | POA: Diagnosis present

## 2018-03-22 DIAGNOSIS — N186 End stage renal disease: Secondary | ICD-10-CM | POA: Diagnosis present

## 2018-03-22 DIAGNOSIS — K6389 Other specified diseases of intestine: Secondary | ICD-10-CM | POA: Diagnosis present

## 2018-03-22 DIAGNOSIS — Z888 Allergy status to other drugs, medicaments and biological substances status: Secondary | ICD-10-CM

## 2018-03-22 DIAGNOSIS — D631 Anemia in chronic kidney disease: Secondary | ICD-10-CM | POA: Diagnosis present

## 2018-03-22 DIAGNOSIS — Z9852 Vasectomy status: Secondary | ICD-10-CM

## 2018-03-22 DIAGNOSIS — I2510 Atherosclerotic heart disease of native coronary artery without angina pectoris: Secondary | ICD-10-CM | POA: Diagnosis present

## 2018-03-22 DIAGNOSIS — Z7902 Long term (current) use of antithrombotics/antiplatelets: Secondary | ICD-10-CM

## 2018-03-22 DIAGNOSIS — Z992 Dependence on renal dialysis: Secondary | ICD-10-CM | POA: Diagnosis not present

## 2018-03-22 DIAGNOSIS — E785 Hyperlipidemia, unspecified: Secondary | ICD-10-CM | POA: Diagnosis present

## 2018-03-22 DIAGNOSIS — Z79899 Other long term (current) drug therapy: Secondary | ICD-10-CM

## 2018-03-22 DIAGNOSIS — Z880 Allergy status to penicillin: Secondary | ICD-10-CM

## 2018-03-22 DIAGNOSIS — Z91048 Other nonmedicinal substance allergy status: Secondary | ICD-10-CM

## 2018-03-22 LAB — HEPATIC FUNCTION PANEL
ALBUMIN: 3 g/dL — AB (ref 3.5–5.0)
ALK PHOS: 53 U/L (ref 38–126)
ALT: 22 U/L (ref 0–44)
AST: 22 U/L (ref 15–41)
BILIRUBIN TOTAL: 0.6 mg/dL (ref 0.3–1.2)
Bilirubin, Direct: <0.1 mg/dL (ref 0.0–0.2)
TOTAL PROTEIN: 6.1 g/dL — AB (ref 6.5–8.1)

## 2018-03-22 LAB — CBC
HEMATOCRIT: 22.5 % — AB (ref 39.0–52.0)
HEMOGLOBIN: 6.8 g/dL — AB (ref 13.0–17.0)
MCH: 23.9 pg — ABNORMAL LOW (ref 26.0–34.0)
MCHC: 30.2 g/dL (ref 30.0–36.0)
MCV: 78.9 fL — AB (ref 80.0–100.0)
Platelets: 127 10*3/uL — ABNORMAL LOW (ref 150–400)
RBC: 2.85 MIL/uL — ABNORMAL LOW (ref 4.22–5.81)
RDW: 17.5 % — AB (ref 11.5–15.5)
WBC: 5 10*3/uL (ref 4.0–10.5)
nRBC: 0 % (ref 0.0–0.2)

## 2018-03-22 LAB — BASIC METABOLIC PANEL
ANION GAP: 12 (ref 5–15)
BUN: 44 mg/dL — ABNORMAL HIGH (ref 6–20)
CHLORIDE: 96 mmol/L — AB (ref 98–111)
CO2: 31 mmol/L (ref 22–32)
Calcium: 9 mg/dL (ref 8.9–10.3)
Creatinine, Ser: 7.78 mg/dL — ABNORMAL HIGH (ref 0.61–1.24)
GFR calc non Af Amer: 7 mL/min — ABNORMAL LOW (ref >60)
GFR, EST AFRICAN AMERICAN: 8 mL/min — AB (ref >60)
Glucose, Bld: 93 mg/dL (ref 70–99)
Potassium: 5.3 mmol/L — ABNORMAL HIGH (ref 3.5–5.1)
SODIUM: 139 mmol/L (ref 135–145)

## 2018-03-22 LAB — RETICULOCYTES
Immature Retic Fract: 10.5 % (ref 2.3–15.9)
RBC.: 2.85 MIL/uL — ABNORMAL LOW (ref 4.22–5.81)
Retic Count, Absolute: 51.6 10*3/uL (ref 19.0–186.0)
Retic Ct Pct: 1.8 % (ref 0.4–3.1)

## 2018-03-22 LAB — PROTIME-INR
INR: 1.1 (ref 0.8–1.2)
Prothrombin Time: 13.8 seconds (ref 11.4–15.2)

## 2018-03-22 LAB — IRON AND TIBC
IRON: 42 ug/dL — AB (ref 45–182)
SATURATION RATIOS: 21 % (ref 17.9–39.5)
TIBC: 200 ug/dL — ABNORMAL LOW (ref 250–450)
UIBC: 158 ug/dL

## 2018-03-22 LAB — VITAMIN B12: Vitamin B-12: 592 pg/mL (ref 180–914)

## 2018-03-22 LAB — MRSA PCR SCREENING: MRSA BY PCR: NEGATIVE

## 2018-03-22 LAB — FOLATE: FOLATE: 26.2 ng/mL (ref >5.9)

## 2018-03-22 LAB — FERRITIN: Ferritin: 1223 ng/mL — ABNORMAL HIGH (ref 24–336)

## 2018-03-22 MED ORDER — EPOETIN ALFA 20000 UNIT/ML IJ SOLN
20000.0000 [IU] | INTRAMUSCULAR | Status: DC
  Filled 2018-03-22: qty 1

## 2018-03-22 MED ORDER — CHLORHEXIDINE GLUCONATE CLOTH 2 % EX PADS
6.0000 | MEDICATED_PAD | Freq: Every day | CUTANEOUS | Status: DC
  Administered 2018-03-23 – 2018-03-24 (×2): 6 via TOPICAL

## 2018-03-22 MED ORDER — PANTOPRAZOLE SODIUM 40 MG IV SOLR
40.0000 mg | Freq: Two times a day (BID) | INTRAVENOUS | Status: DC
  Administered 2018-03-22 – 2018-03-25 (×7): 40 mg via INTRAVENOUS
  Filled 2018-03-22 (×7): qty 40

## 2018-03-22 NOTE — H&P (Signed)
TRH H&P   Patient Demographics:    Stanley Stewart, is a 63 y.o. male  MRN: [id number]   DOB - 12-Nov-1955  Admit Date - 03/22/2018  Outpatient Primary MD for the patient is [name], MD  Referring MD/NP/PA: Dr Paulita Fujita  Outpatient Specialists: GI Dr. Paulita Fujita, renal Dr Kathe Mariner, cardiology Dr. [name]  Patient coming from: GI office  No chief complaint on file.     HPI:    Stanley Stewart  is a 63 y.o. male, with past medical history of SLE, CAD, ESRD, on dialysis Monday Wednesday Friday, hyperlipidemia, hypertension, presents to GI for evaluation for low hemoglobin of 6.9, report dizziness, lightheadedness, labs were obtained during most recent dialysis on Monday, when hemoglobin was 6.9, dropped from 12.6 last month, patient report at baseline dark-colored stool secondary to iron supplements, but it was more tarry recently, he was Hemoccult positive at the dialysis center, GI office, given his Jehovah's Witness, they recommended admission for stabilization before discovery/colonoscopy, and reports he is compliant with his Plavix, denies any other NSAIDs use, any over-the-counter medications, has any dysphagia, GERD symptoms, nausea, vomiting, coffee-ground emesis, denies any red blood in the stools, colonoscopy 2010, significant for proximal colonic AVMs. -Per my discussion with GI, they requested cardiology consult for cardiac clearance and holding Plavix, renal has been consulted to resume dialysis on Monday Wednesday Friday schedule.   Review of systems:    In addition to the HPI above, No Fever-chills, reports dizziness, weak No Headache, No changes with Vision or hearing, No problems swallowing food or Liquids, No Chest pain, Cough or hotness of breath No Abdominal pain, No Nausea or Vommitting, Bowel movements are regular, No Blood in stool or Urine, No dysuria,  No new skin rashes or bruises, No new joints pains-aches,  No new weakness, tingling, numbness in any extremity, No recent weight gain or loss, No polyuria, polydypsia or polyphagia, No significant Mental Stressors.  A full 10 point Review of Systems was done, except as stated above, all other Review of Systems were negative.   With Past History of the following :    Past Medical History:  Diagnosis Date  . Anemia   . Arthritis   . Coronary artery disease    a. 12/2012 Cath/PTCA: LM nl, LAD 24m, LCX nl, RCA 30d, RPDA 99 (PTCA only - 1.5-81mm vessel), EF 55%, subtle apical HK.  [name] ESRD (end stage renal disease) ([place])    Patient started dialysis around 2010.  Pt states the cause of ESRD was lupus and/or HBP.  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 Gets hemodialysis at [facility]) on MWF schedule using L arm AVF.    [name] Herpes zoster   . Hypertension   . Iritis   . Lupus ([place]) 2002  . 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  Peripheral neuropathy   . Pseudoaneurysm ([place])    Enlarged Left   FA    AVF  . Vertigo       Past Surgical History:  Procedure Laterality Date  . AV FISTULA PLACEMENT  05/28/2006   LEFT forearm AVF  . CARDIAC CATHETERIZATION  12/20/2012  . COLONOSCOPY    . CORONARY ANGIOPLASTY  12/20/2012  . FISTULA SUPERFICIALIZATION Left 7/35/3299   Procedure: PLICATION OF RADIOCEPHALIC ARTERIOVENOUS FISTULA;  Surgeon: Conrad Cidra, MD;  Location: [facility];  Service: Vascular;  Laterality: Left;  . FRACTURE SURGERY  Oct. 2009   Right Hip  . LEFT HEART CATHETERIZATION WITH CORONARY ANGIOGRAM N/A 12/20/2012   Procedure: LEFT HEART CATHETERIZATION WITH CORONARY ANGIOGRAM;  Surgeon: Burnell Blanks, MD;  Location: [facility] CATH LAB;  Service: Cardiovascular;  Laterality: N/A;  . lymph node biopsy right arm    . VASECTOMY        Social History:     Social History   Tobacco Use  . Smoking  status: Never Smoker  . Smokeless tobacco: Never Used  Substance Use Topics  . Alcohol use: No     Lives -at home  Mobility -dependent     Family History :     Family History  Problem Relation Age of Onset  . Hypertension Mother   . CAD Father   . Hypertension Father      Home Medications:   Prior to Admission medications   Medication Sig Start Date End Date Taking? Authorizing Provider  amLODipine (NORVASC) 5 MG tablet TAKE 1 TABLET BY MOUTH ONCE DAILY 02/02/18   Lyda Jester M, PA-C  atorvastatin (LIPITOR) 40 MG tablet Take 1 tablet (40 mg total) by mouth daily at 6 PM. 03/01/18   Burnell Blanks, MD  clopidogrel (PLAVIX) 75 MG tablet TAKE 1 TABLET BY MOUTH ONCE DAILY 01/11/18   Lyda Jester M, PA-C  famotidine (PEPCID) 20 MG tablet Take 20 mg by mouth as needed for heartburn or indigestion.    [provider]  FOSRENOL 1000 MG chewable tablet Chew 1,000 mg by mouth 3 (three) times daily with meals. 09/28/15   [provider]  hydroxychloroquine (PLAQUENIL) 200 MG tablet Take 200 mg by mouth daily.     [provider]  predniSONE (DELTASONE) 5 MG tablet Take 5 mg by mouth daily.     [provider]     Allergies:     Allergies  Allergen Reactions  . Adhesive [Tape]     Pulls skin off,  ONLY use paper tape.   [name] Penicillins Other (See Comments)    Patient states that MD told him he was allergic to this med but he does not recall having any reactions   . Amitriptyline Rash     Physical Exam:   Vitals  Height 5\' 9"  (1.753 m), weight 57.2 kg.   1. General frail elderly male, sitting in bed in no apparent distress  2. Normal affect and insight, Not Suicidal or Homicidal, Awake Alert, Oriented X 3.  3. No F.N deficits, ALL C.Nerves Intact, Strength 5/5 all 4 extremities, Sensation intact all 4 extremities, Plantars down going.  4. Ears and Eyes appear Normal, Conjunctivae clear, PERRLA. Moist Oral Mucosa.  5.  Supple Neck, No JVD, No cervical lymphadenopathy appriciated, No Carotid Bruits.  6. Symmetrical Chest wall movement, Good air movement bilaterally, CTAB.  7. RRR, No Gallops, Rubs or Murmurs, No Parasternal Heave.  8. Positive Bowel Sounds, Abdomen Soft, No tenderness,  No organomegaly appriciated,No rebound -guarding or rigidity.  9.  No Cyanosis, Normal Skin Turgor, has alopecia secondary to lupus, has generalized skin chronic changes secondary to lupus  10. Good muscle tone, deformed bilateral hand joints secondary to lupus, no effusions, Normal ROM.  11. No Palpable Lymph Nodes in Neck or Axillae    Data Review:   Abs from his recent dialysis 03/19/2018 showing hemoglobin of 6.9, platelet count of 294k, I ordered CBC, BMP, LFTs, INR,  CBC No results for input(s): WBC, HGB, HCT, PLT, MCV, MCH, MCHC, RDW, LYMPHSABS, MONOABS, EOSABS, BASOSABS, BANDABS in the last 168 hours.  Invalid input(s): NEUTRABS, BANDSABD ------------------------------------------------------------------------------------------------------------------  Chemistries  No results for input(s): NA, K, CL, CO2, GLUCOSE, BUN, CREATININE, CALCIUM, MG, AST, ALT, ALKPHOS, BILITOT in the last 168 hours.  Invalid input(s): GFRCGP ------------------------------------------------------------------------------------------------------------------ CrCl cannot be calculated (Patient's most recent lab result is older than the maximum 21 days allowed.). ------------------------------------------------------------------------------------------------------------------ No results for input(s): TSH, T4TOTAL, T3FREE, THYROIDAB in the last 72 hours.  Invalid input(s): FREET3  Coagulation profile No results for input(s): INR, PROTIME in the last 168 hours. ------------------------------------------------------------------------------------------------------------------- No results for input(s): DDIMER in the last 72 hours.  -------------------------------------------------------------------------------------------------------------------  Cardiac Enzymes No results for input(s): CKMB, TROPONINI, MYOGLOBIN in the last 168 hours.  Invalid input(s): CK ------------------------------------------------------------------------------------------------------------------ No results found for: BNP   ---------------------------------------------------------------------------------------------------------------  Urinalysis No results found for: COLORURINE, APPEARANCEUR, LABSPEC, [place], GLUCOSEU, HGBUR, BILIRUBINUR, KETONESUR, PROTEINUR, UROBILINOGEN, NITRITE, LEUKOCYTESUR  ----------------------------------------------------------------------------------------------------------------   Imaging Results:    No results found.  EKG is pending   Assessment & Plan:    Active Problems:   End stage renal disease ([facility])   CAD (coronary artery disease)   Lupus ([facility])   Anemia   Hypertension   Acute blood loss anemia -Remain 12.6 last month, 6.92 days ago at dialysis, Hemoccult positive, he reports dark-colored stool at baseline secondary to iron supplements. -Work-up per GI, will need endoscopy/colonoscopy when more stable, GI requested cardiac clearance for procedures, and holding Plavix. -Patient is a Sales promotion account executive Witness, and reports absolutely under no circumstances he want to be transfused any blood products, will obtain CBC, anemia panel, discussed with renal, they will order IV iron and Procrit as indicated after his work-up. -We will hold Plavix currently, will start on Protonix 40 IV twice daily, he denies any NSAIDs use.  ESRD -Dialysis Monday, Wednesday, Friday, renal consulted  Lupus -Continue with home meds including prednisone  Polycystic kidney disease, with renal/liver mass -Has been followed closely by urology at [facility], defer further management after them,,  - GI will follow, will  await further recommendation regarding liver mass.  CAD -Patient denies any chest pain -Hold Plavix, continue with statin, will obtain EKG, cardiology consulted  Hypertension -Blood pressure is acceptable, continue with home meds        DVT Prophylaxis - SCDs   AM Labs Ordered, also please review Full Orders  Family Communication: Admission, patients condition and plan of care including tests being ordered have been discussed with the patient and Daughter who indicate understanding and agree with the plan and Code Status.  Code Status Full, but does not wish to be kept on artificial support if hopeless prognosis  Likely DC to  Home  Condition GUARDED    Consults called: Renal, GI, cardiology  Admission status: Inpatient  Time spent in minutes : 60 minutes   Phillips Climes M.D on 03/22/2018 at 3:42 PM  Between 7am to 7pm - Pager - [phone]. After 7pm go  to [email or web address] - password [facility]  [facility] - Office  [phone]

## 2018-03-22 NOTE — Progress Notes (Signed)
Panic lab: hbg 6.8.  Notified dr. Waldron Labs

## 2018-03-22 NOTE — Progress Notes (Signed)
CRITICAL VALUE ALERT  Critical Value: hemoglobin 6.8  Date & Time Notied: 03/23/2018 1720  Provider Notified: Dr. Waldron Labs  Orders Received/Actions taken: Per MD no transfusions due to patient being a Jehovah Witness

## 2018-03-22 NOTE — Consult Note (Addendum)
Cosign Needed         [facility] Renal Consultation Note    Indication for Consultation:  Management of ESRD/hemodialysis; anemia, hypertension/volume and secondary hyperparathyroidism PCP:  HPI: Stanley Stewart is a 63 y.o. male with ESRD on hemodialysis MWF at [facility]. He has been on hemodialysis since 2008. Patient is Jehovah Witness. PMH: SLE, HTN, CAD, LVH, grade 2 DD with EF 60-65%, Herpes Zoster, arthritis, AOCD, SHPT. He is compliant with HD, never known him to miss HD. Last HD 03/21/2018. Left tx at [facility].   OP HGB 10.7 03/07/2018 fell to 7.5 03/14/2018,  to 6.9 03/19/2018. He was referred to [facility] GI from HD center. Positive stool cards at HD center 03/22/18. He has been admitted for evaluation of GIB with endoscopy planned S/P plavix wash out.   Currently, he has no complaints. Denies bloody or tarry stool but takes velphoro so stool has been dark. Says he's been tired but otherwise no overt complaints. No overt blood loss reported.       Past Medical History:  Diagnosis Date  . Anemia   . Arthritis   . Coronary artery disease    a. 12/2012 Cath/PTCA: LM nl, LAD 43m, LCX nl, RCA 30d, RPDA 99 (PTCA only - 1.5-74mm vessel), EF 55%, subtle apical HK.  [name] ESRD (end stage renal disease) ([place])    Patient started dialysis around 2010.  Pt states the cause of ESRD was lupus and/or HBP.  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 Gets hemodialysis at [facility]) on MWF schedule using L arm AVF.    [name] Herpes zoster   . Hypertension   . Iritis   . Lupus ([place]) 2002  . 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 Peripheral neuropathy   . Pseudoaneurysm ([place])    Enlarged Left   FA    AVF  . Vertigo         Past Surgical History:  Procedure Laterality Date  . AV FISTULA PLACEMENT  05/28/2006   LEFT forearm AVF  . CARDIAC CATHETERIZATION  12/20/2012   . COLONOSCOPY    . CORONARY ANGIOPLASTY  12/20/2012  . FISTULA SUPERFICIALIZATION Left 4/70/9628   Procedure: PLICATION OF RADIOCEPHALIC ARTERIOVENOUS FISTULA;  Surgeon: Conrad La Minita, MD;  Location: [place];  Service: Vascular;  Laterality: Left;  . FRACTURE SURGERY  Oct. 2009   Right Hip  . LEFT HEART CATHETERIZATION WITH CORONARY ANGIOGRAM N/A 12/20/2012   Procedure: LEFT HEART CATHETERIZATION WITH CORONARY ANGIOGRAM;  Surgeon: Burnell Blanks, MD;  Location: [facility] CATH LAB;  Service: Cardiovascular;  Laterality: N/A;  . lymph node biopsy right arm    . VASECTOMY          Family History  Problem Relation Age of Onset  . Hypertension Mother   . CAD Father   . Hypertension Father    Social History:  reports that he has never smoked. He has never used smokeless tobacco. He reports that he does not drink alcohol or use drugs.      Allergies  Allergen Reactions  . Adhesive [Tape]     Pulls skin off,  ONLY use paper tape.   [name] Penicillins Other (See Comments)    Patient states that MD told him he was allergic to this med but he does not recall having any reactions   . Amitriptyline Rash  . Fluconazole Rash  Prior to Admission medications   Medication Sig Start Date End Date Taking? Authorizing Provider  acetaminophen (TYLENOL) 500 MG tablet Take 500 mg by mouth every 6 (six) hours as needed for mild pain or headache.   Yes [provider]  amLODipine (NORVASC) 5 MG tablet TAKE 1 TABLET BY MOUTH ONCE DAILY 02/02/18  Yes Lyda Jester M, PA-C  atorvastatin (LIPITOR) 40 MG tablet Take 1 tablet (40 mg total) by mouth daily at 6 PM. 03/01/18  Yes McAlhany, Annita Brod, MD  B Complex-C-Folic Acid (RENA-VITE RX) 1 MG TABS Take 1 tablet by mouth 3 (three) times a week. Mon, Wed, Fri. 12/06/17  Yes [provider]  clopidogrel (PLAVIX) 75 MG tablet TAKE 1 TABLET BY MOUTH ONCE DAILY 01/11/18  Yes Lyda Jester M, PA-C  epoetin  alfa (EPOGEN,PROCRIT) 4000 UNIT/ML injection Inject 4,000 Units into the vein every 30 (thirty) days. At Dialysis   Yes [provider]  Etelcalcetide HCl 2.5 MG/0.5ML SOLN Inject 2.5 mg into the vein 3 (three) times a week.   Yes [provider]  FOSRENOL 750 MG chewable tablet Chew 750 mg by mouth 3 (three) times daily with meals.  09/28/15  Yes [provider]  gabapentin (NEURONTIN) 100 MG capsule Take 100-200 mg by mouth at bedtime.   Yes [provider]  hydroxychloroquine (PLAQUENIL) 200 MG tablet Take 200 mg by mouth daily.    Yes [provider]  predniSONE (DELTASONE) 5 MG tablet Take 5 mg by mouth daily.    Yes [provider]            Current Facility-Administered Medications  Medication Dose Route Frequency Provider Last Rate Last Dose  . Chlorhexidine Gluconate Cloth 2 % PADS 6 each  6 each Topical Q0600 Roney Jaffe, MD   6 each at 03/23/18 [phone]  . epoetin alfa (EPOGEN,PROCRIT) injection 20,000 Units  20,000 Units Subcutaneous Q M,W,F-HD Roney Jaffe, MD      . pantoprazole (PROTONIX) injection 40 mg  40 mg Intravenous Q12H Elgergawy, Silver Huguenin, MD   40 mg at 03/22/18 2246   Labs: Basic Metabolic Panel: LastLabs     Recent Labs  Lab 03/22/18 1640  NA 139  K 5.3*  CL 96*  CO2 31  GLUCOSE 93  BUN 44*  CREATININE 7.78*  CALCIUM 9.0     Liver Function Tests: LastLabs     Recent Labs  Lab 03/22/18 1640  AST 22  ALT 22  ALKPHOS 53  BILITOT 0.6  PROT 6.1*  ALBUMIN 3.0*     LastLabs  No results for input(s): LIPASE, AMYLASE in the last 168 hours.   LastLabs  No results for input(s): AMMONIA in the last 168 hours.   CBC: LastLabs      Recent Labs  Lab 03/22/18 1640 03/23/18 0800  WBC 5.0 4.4  HGB 6.8* 5.9*  HCT 22.5* 19.2*  MCV 78.9* 78.4*  PLT 127* PENDING     Cardiac Enzymes: LastLabs  No results for input(s): CKTOTAL, CKMB, CKMBINDEX, TROPONINI in the  last 168 hours.   CBG: LastLabs  No results for input(s): GLUCAP in the last 168 hours.   Iron Studies:  RecentLabs(last2labs)     Recent Labs    03/22/18 1640  IRON 42*  TIBC 200*  FERRITIN 1,223*     Studies/Results: ImagingResults(Last48hours)  No results found.    ROS: As per HPI otherwise negative.   Physical Exam:       Vitals:   03/22/18  1508 03/22/18 2305 03/22/18 2307 03/23/18 0603  BP: (!) 147/62 (!) 120/56  (!) 104/56  Pulse: 80 79  77  Resp: (!) 24 18  18   Temp: 98.3 F (36.8 C)  98.3 F (36.8 C) 97.9 F (36.6 C)  TempSrc: Oral   Oral  SpO2: 97%   100%  Weight: 57.2 kg     Height: 5\' 9"  (1.753 m)        General: Chronically ill appearing, very pleasant AA male in NAD Head: Normocephalic, atraumatic, sclera non-icteric, mucus membranes are moist Neck: Supple. JVD not elevated. Lungs: Clear bilaterally to auscultation without wheezes, rales, or rhonchi. Breathing is unlabored. Heart: RRR with S1 S2. No murmurs, rubs, or gallops appreciated. Abdomen: Soft, non-tender, non-distended with normoactive bowel sounds. No rebound/guarding. No obvious abdominal masses. M-S:  Arthritic changes to hands and feet. Lower extremities:without edema or ischemic changes, no open wounds  Neuro: Alert and oriented X 3. Moves all extremities spontaneously. Psych:  Responds to questions appropriately with a normal affect. Dialysis Access: L AVF aneurysmal, + bruit.    Home meds:  - amlodipine 5 qd  - atorvastatin 40 qhs/ clopidogrel 75 qd  - hydroxychloroquine 200 qd/ prednisone 5 mg qd  - fosrenol 1 gm tid ac/ famotidine 20mg  prn heartburn  Dialysis: [facility] MWF 3 hr 15 min 180 NRe 400/Autoflow 1.5 57.5 kg 2.0 K/2.0 Ca UFP 3  L AVF -Heparin 1000 units IV TIW -Hectorol 6 mcg IV TIW -Parsabiv 10 mg IV TIW -Mircera 200 mcg IV q 2 weeks (last dose Mircera 30 mcg IV 03/12/18 Last HGB 7.1 03/21/18)   Assessment/Plan: 1.   ABLA-possible GIB-per primary. GI consulted. HGB 5.9. Patient is Jehovah Witness-does not accept blood products. Epogen ordered with HD.   2.  ESRD - MWF. HD Friday.  No heparin.  3.  Hypertension/volume  - BP controlled, no evidence of volume overload by exam. Adm wt 57.2 kg. UFG 0.5-1 liters.  4.  Anemia  - As noted above.  5.  Metabolic bone disease - Ca 9.0 C Ca 9.8. Hold Velphoro binders-use Forenol while in hospital. Hermina Staggers not available on hospital formulary. Continue VDRA,  6.  Nutrition - Cl liquid diet. Albumin 3.0 Renal diet, prostat renal vit when eating.  7.  SLE-per primary 8.  CAD-seen by cards for clearance to hold plavix. No chest pain.    [facility] 03/23/2018, 1:38 PM

## 2018-03-22 NOTE — Progress Notes (Signed)
Patient arrived as a direct admit from Dr. Lavetta Nielsen office. Daughter - Devonna at bedside.  Notified MD. Of patients arrival, awaiting orders. Patient AAOx4, No apparent distress, denies pain.

## 2018-03-22 NOTE — Plan of Care (Signed)

## 2018-03-22 NOTE — Consult Note (Signed)
Cardiology Consultation:   Patient ID: [name] MRN: [id number]; DOB: Dec 31, 1955  Admit date: 03/22/2018 Date of Consult: 03/22/2018  Primary Care Provider: [name], MD Primary Cardiologist: Lauree Chandler, MD  Primary Electrophysiologist:  None    Patient Profile:   Stanley Stewart is a 63 y.o. male with a hx of CAD s/p PTCA to [place] 2014, ESRD on HD, lupus, mild AS/AI, Jehovah's Witness who is being seen today for the evaluation of anemia on Plavix and clearance for colonoscopy at the request of Dr. Waldron Labs.  History of Present Illness:   Stanley Stewart has a hx of PTCA to RPDA in 2014. He has ESRD on HD and lupus. During workup for renal transplant he had a nuclear stress test 06/28/2017 that showed remote inferior wall infarct with small area of inducible peri-infarct ischemia, normal LV function. This was similar to test in 2017. Last echo at [facility] in 11/2015 showed EF 50-55% with moderate LVH, grade 2 DD with no RWMA. Echo at [facility] 06/27/2017 showed EF 60-65%, mild concentric LVH. He was continued on medical therapy and has remained stable with no further ischemic symptoms.   He was seen at his gastroenterology office today by Dr. Paulita Fujita referred by his nephrologist for Hgb of 6.9. He was hemoccult positive. His Hgb had been 12 last month. Dr. Paulita Fujita referred him to the hospital with plan for EGD and colonoscopy as pt is Jehovah's Witness and will not take blood products.  Dr. Paulita Fujita requested cardiac clearance and permission to hold Plavix.   Stanley Stewart says that his Hgb was low at dialysis on Friday. It was even lower at dial on Monday so he was sent to GI. He notes dark stools but he states that he has had dark stools for several years that he relates to Fosrenol as when he stopped it briefly one time his stool color lightened up. He has seen no obvious blood. He denies any chest pain or shortness of breath. He has mild DOE with walking up stair that he related to  deconditioning. No orthopnea, PND or edema. He feels that he is stable from a cardiac perspective.    Past Medical History:  Diagnosis Date  . Anemia   . Arthritis   . Coronary artery disease    a. 12/2012 Cath/PTCA: LM nl, LAD 75m, LCX nl, RCA 30d, RPDA 99 (PTCA only - 1.5-21mm vessel), EF 55%, subtle apical HK.  [name] ESRD (end stage renal disease) ([place])    Patient started dialysis around 2010.  Pt states the cause of ESRD was lupus and/or HBP.  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 Gets hemodialysis at [facility]) on MWF schedule using L arm AVF.    [name] Herpes zoster   . Hypertension   . Iritis   . Lupus ([place]) 2002  . 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 Peripheral neuropathy   . Pseudoaneurysm ([place])    Enlarged Left   FA    AVF  . Vertigo     Past Surgical History:  Procedure Laterality Date  . AV FISTULA PLACEMENT  05/28/2006   LEFT forearm AVF  . CARDIAC CATHETERIZATION  12/20/2012  . COLONOSCOPY    . CORONARY ANGIOPLASTY  12/20/2012  . FISTULA SUPERFICIALIZATION Left 06/24/2977   Procedure: PLICATION OF RADIOCEPHALIC ARTERIOVENOUS FISTULA;  Surgeon: Conrad Kaplan, MD;  Location: [place];  Service: Vascular;  Laterality: Left;  .  FRACTURE SURGERY  Oct. 2009   Right Hip  . LEFT HEART CATHETERIZATION WITH CORONARY ANGIOGRAM N/A 12/20/2012   Procedure: LEFT HEART CATHETERIZATION WITH CORONARY ANGIOGRAM;  Surgeon: Burnell Blanks, MD;  Location[facility] C[facility] LAB;  Service: Cardiovascular;  Laterality: N/A;  . lymph node biopsy right arm    . VASECTOMY       Home Medications:  Prior to Admission medications   Medication Sig Start Date End Date Taking? Authorizing Provider  amLODipine (NORVASC) 5 MG tablet TAKE 1 TABLET BY MOUTH ONCE DAILY 02/02/18   Lyda Jester M, PA-C  atorvastatin (LIPITOR) 40 MG tablet Take 1 tablet (40 mg total) by mouth daily at 6 PM. 03/01/18   Burnell Blanks, MD   clopidogrel (PLAVIX) 75 MG tablet TAKE 1 TABLET BY MOUTH ONCE DAILY 01/11/18   Lyda Jester M, PA-C  famotidine (PEPCID) 20 MG tablet Take 20 mg by mouth as needed for heartburn or indigestion.    [provider]  FOSRENOL 1000 MG chewable tablet Chew 1,000 mg by mouth 3 (three) times daily with meals. 09/28/15   [provider]  hydroxychloroquine (PLAQUENIL) 200 MG tablet Take 200 mg by mouth daily.     [provider]  predniSONE (DELTASONE) 5 MG tablet Take 5 mg by mouth daily.     [provider]    Inpatient Medications: Scheduled Meds: . pantoprazole (PROTONIX) IV  40 mg Intravenous Q12H   Continuous Infusions:  PRN Meds:   Allergies:    Allergies  Allergen Reactions  . Adhesive [Tape]     Pulls skin off,  ONLY use paper tape.   [name] Penicillins Other (See Comments)    Patient states that MD told him he was allergic to this med but he does not recall having any reactions   . Amitriptyline Rash    Social History:   Social History   Socioeconomic History  . Marital status: Married    Spouse name: Not on file  . Number of children: 2  . Years of education: Not on file  . Highest education level: Not on file  Occupational History  . Occupation: Disability  Social Needs  . Financial resource strain: Not on file  . Food insecurity:    Worry: Not on file    Inability: Not on file  . Transportation needs:    Medical: Not on file    Non-medical: Not on file  Tobacco Use  . Smoking status: Never Smoker  . Smokeless tobacco: Never Used  Substance and Sexual Activity  . Alcohol use: No  . Drug use: No  . Sexual activity: Not on file  Lifestyle  . Physical activity:    Days per week: Not on file    Minutes per session: Not on file  . Stress: Not on file  Relationships  . Social connections:    Talks on phone: Not on file    Gets together: Not on file    Attends religious service: Not on file    Active member of club or  organization: Not on file    Attends meetings of clubs or organizations: Not on file    Relationship status: Not on file  . Intimate partner violence:    Fear of current or ex partner: Not on file    Emotionally abused: Not on file    Physically abused: Not on file    Forced sexual activity: Not on file  Other Topics Concern  . Not on file  Social History Narrative  . Not on file    Family History:    Family History  Problem Relation Age of Onset  . Hypertension Mother   . CAD Father   . Hypertension Father      ROS:  Please see the history of present illness.   All other ROS reviewed and negative.     Physical Exam/Data:   Vitals:   03/22/18 1508  Weight: 57.2 kg  Height: 5\' 9"  (1.753 m)   No intake or output data in the 24 hours ending 03/22/18 1538 Last 3 Weights 03/22/2018 01/12/2018 01/04/2017  Weight (lbs) 126 lb 1.7 oz 126 lb 1.9 oz 118 lb 12.8 oz  Weight (kg) 57.2 kg 57.208 kg 53.887 kg     Body mass index is 18.62 kg/m.  General:  Thin male, in no acute distress HEENT: normal Neck: no JVD Endocrine:  No thryomegaly Vascular: Left arm AV fistula with +bruit and thrill, bruit radiates to neck and cardiac Cardiac:  normal S1, S2; RRR;  Lungs:  clear to auscultation bilaterally, no wheezing, rhonchi or rales  Abd: soft, nontender, no hepatomegaly  Ext: no edema Musculoskeletal:  Hand and foot bony deformities related to lupus Skin: warm and dry  Neuro:  CNs 2-12 intact, no focal abnormalities noted Psych:  Normal affect   EKG:  Not done, ordered.  Telemetry:  Telemetry was personally reviewed and demonstrates:  Sinus rhythm in the 70's  Relevant CV Studies:  Nuclear stress test 06/28/2017 CONCLUSION:  1.  Remote inferior wall infarct with a small area of inducible peri-infarct ischemia. 2.  Otherwise, normal left ventricular function.  Echocardiogram 06/27/2017 SUMMARY The left ventricular size is normal. There is mild concentric left ventricular  hypertrophy.  Left ventricular systolic function is normal. The right ventricle is normal in size and function. The left atrial size is normal.  Right atrial size is normal. There is aortic valve sclerosis. There is mild aortic regurgitation. There is mild mitral annular calcification. Mild aortic root dilatation. There is no pericardial effusion. - FINDINGS:  LEFT VENTRICLE The left ventricular size is normal. There is mild concentric left  ventricular hypertrophy. Left ventricular systolic function is normal. LV  ejection fraction = 60-65%. Left ventricular filling pattern is prolonged  relaxation. No segmental wall motion abnormalities seen in the left ventricle. -  Laboratory Data:  ChemistryNo results for input(s): NA, K, CL, CO2, GLUCOSE, BUN, CREATININE, CALCIUM, GFRNONAA, GFRAA, ANIONGAP in the last 168 hours.  No results for input(s): PROT, ALBUMIN, AST, ALT, ALKPHOS, BILITOT in the last 168 hours. HematologyNo results for input(s): WBC, RBC, HGB, HCT, MCV, MCH, MCHC, RDW, PLT in the last 168 hours. Cardiac EnzymesNo results for input(s): TROPONINI in the last 168 hours. No results for input(s): TROPIPOC in the last 168 hours.  BNPNo results for input(s): BNP, PROBNP in the last 168 hours.  DDimer No results for input(s): DDIMER in the last 168 hours.  Radiology/Studies:  No results found.  Assessment and Plan:   Anemia, poss GI bleed -Pt found to have Hgb 6.9 by nephrology. Seen by GI, Dr. Paulita Fujita, at the office. Hemoccult positive. Pt referred to hospital for endoscopy/colonoscopy. Dr. Paulita Fujita requested cardiology consult for cardiac clearance and holding Plavix.  -Jehovah's Witness, will not take blood products.  -Pt is low risk for low risk EGD/colonoscpy.  -OK to hold plavix for procedure.   CAD -Pt with hx of PTCA to RPDA in 2014.  Nuclear stress test that showed remote inferior wall  infarct with small area of inducible peri-infarct ischemia, normal LV  function. This was similar to test in 2017.  -Medical therapy includes Plavix and statin.  -Hold Plavix for planned procedure. Continue statin.  -No angina.   ESRD on HD -Followed by nephrology. -Pt is in the process of being evaluated for renal transplant at [facility].  Hypertension -At home on amlodipine 5 mg daily -BP acceptable  Hyperlipidemia -On atorvastatin 40 mg daily  Lupus -management per IM.   Polycystic kidney disease, with renal/liver mass -Being followed at [facility].    For questions or updates, please contact [place] Please consult [email or web address] for contact info under     Signed, Daune Perch, NP  03/22/2018 3:38 PM

## 2018-03-23 ENCOUNTER — Inpatient Hospital Stay (HOSPITAL_COMMUNITY): Payer: Medicare Other | Admitting: Certified Registered"

## 2018-03-23 ENCOUNTER — Encounter (HOSPITAL_COMMUNITY)
Admission: AD | Disposition: A | Discharge status: Home / Self Care | Payer: Self-pay | Source: Home / Self Care | Attending: Internal Medicine

## 2018-03-23 ENCOUNTER — Encounter (HOSPITAL_COMMUNITY): Payer: Self-pay | Admitting: *Deleted

## 2018-03-23 DIAGNOSIS — K274 Chronic or unspecified peptic ulcer, site unspecified, with hemorrhage: Secondary | ICD-10-CM

## 2018-03-23 DIAGNOSIS — D62 Acute posthemorrhagic anemia: Secondary | ICD-10-CM

## 2018-03-23 HISTORY — PX: HOT HEMOSTASIS: SHX5433

## 2018-03-23 HISTORY — PX: ESOPHAGOGASTRODUODENOSCOPY (EGD) WITH PROPOFOL: SHX5813

## 2018-03-23 HISTORY — PX: BIOPSY: SHX5522

## 2018-03-23 LAB — CBC
HCT: 19.2 % — ABNORMAL LOW (ref 39.0–52.0)
Hemoglobin: 5.9 g/dL — CL (ref 13.0–17.0)
MCH: 24.1 pg — ABNORMAL LOW (ref 26.0–34.0)
MCHC: 30.7 g/dL (ref 30.0–36.0)
MCV: 78.4 fL — ABNORMAL LOW (ref 80.0–100.0)
NRBC: 0 % (ref 0.0–0.2)
Platelets: 108 10*3/uL — ABNORMAL LOW (ref 150–400)
RBC: 2.45 MIL/uL — ABNORMAL LOW (ref 4.22–5.81)
RDW: 17.2 % — ABNORMAL HIGH (ref 11.5–15.5)
WBC: 4.4 10*3/uL (ref 4.0–10.5)

## 2018-03-23 LAB — HIV ANTIBODY (ROUTINE TESTING W REFLEX): HIV Screen 4th Generation wRfx: NONREACTIVE

## 2018-03-23 LAB — OCCULT BLOOD X 1 CARD TO LAB, STOOL: Fecal Occult Bld: NEGATIVE

## 2018-03-23 SURGERY — ESOPHAGOGASTRODUODENOSCOPY (EGD) WITH PROPOFOL
Anesthesia: Monitor Anesthesia Care

## 2018-03-23 MED ORDER — PEG 3350-KCL-NA BICARB-NACL 420 G PO SOLR
4000.0000 mL | Freq: Once | ORAL | Status: AC
  Administered 2018-03-23: 4000 mL via ORAL
  Filled 2018-03-23: qty 4000

## 2018-03-23 MED ORDER — PROPOFOL 10 MG/ML IV BOLUS
INTRAVENOUS | Status: DC | PRN
  Administered 2018-03-23: 20 mg via INTRAVENOUS

## 2018-03-23 MED ORDER — SODIUM CHLORIDE 0.9 % IV SOLN: INTRAVENOUS | Status: DC

## 2018-03-23 MED ORDER — LIDOCAINE 2% (20 MG/ML) 5 ML SYRINGE
INTRAMUSCULAR | Status: DC | PRN
  Administered 2018-03-23: 40 mg via INTRAVENOUS

## 2018-03-23 MED ORDER — EPOETIN ALFA 20000 UNIT/ML IJ SOLN
20000.0000 [IU] | INTRAMUSCULAR | Status: DC
  Administered 2018-03-23: 20000 [IU] via SUBCUTANEOUS
  Filled 2018-03-23: qty 1

## 2018-03-23 MED ORDER — SODIUM CHLORIDE 0.9 % IV SOLN
INTRAVENOUS | Status: DC
  Administered 2018-03-23: 12:00:00 via INTRAVENOUS

## 2018-03-23 MED ORDER — PROPOFOL 500 MG/50ML IV EMUL
INTRAVENOUS | Status: DC | PRN
  Administered 2018-03-23: 50 ug/kg/min via INTRAVENOUS

## 2018-03-23 MED ORDER — PREDNISONE 5 MG PO TABS
5.0000 mg | ORAL_TABLET | Freq: Every day | ORAL | Status: DC
  Administered 2018-03-23 – 2018-03-25 (×3): 5 mg via ORAL
  Filled 2018-03-23 (×3): qty 1

## 2018-03-23 MED ORDER — HYDROXYCHLOROQUINE SULFATE 200 MG PO TABS
200.0000 mg | ORAL_TABLET | Freq: Every day | ORAL | Status: DC
  Administered 2018-03-23 – 2018-03-25 (×3): 200 mg via ORAL
  Filled 2018-03-23 (×3): qty 1

## 2018-03-23 SURGICAL SUPPLY — 14 items

## 2018-03-23 NOTE — Anesthesia Postprocedure Evaluation (Signed)
Anesthesia Post Note  Patient: Stanley Stewart  Procedure(s) Performed: ESOPHAGOGASTRODUODENOSCOPY (EGD) WITH PROPOFOL (N/A ) BIOPSY     Patient location during evaluation: Endoscopy Anesthesia Type: MAC Level of consciousness: awake and alert Pain management: pain level controlled Vital Signs Assessment: post-procedure vital signs reviewed and stable Respiratory status: spontaneous breathing, nonlabored ventilation, respiratory function stable and patient connected to nasal cannula oxygen Cardiovascular status: stable Postop Assessment: no apparent nausea or vomiting Anesthetic complications: no    Last Vitals:  Vitals:   03/23/18 1315 03/23/18 1318  BP:  (!) 136/42  Pulse:  79  Resp:  16  Temp:    SpO2: 97% 98%    Last Pain:  Vitals:   03/23/18 1318  TempSrc:   PainSc: 0-No pain                 Jaryn Halston [place]

## 2018-03-23 NOTE — H&P (View-Only) (Signed)
Primary Care Physician:  [name], MD Primary Gastroenterologist:  Dr. Paulita Fujita   Reason for Consultation:   Anemia  HPI: Stanley Stewart is a 63 y.o. male with past medical history of end-stage renal disease on dialysis, history of lupus, history of coronary artery disease currently on Plavix was seen in the GI clinic.  He was found to have anemia.  Patient is Jehovah's Witness.  Patient with multiple medical comorbidities.  He was advised to get admitted to the hospital for further management and possible EGD and colonoscopy as an inpatient.  Patient seen and examined at bedside.  He has been seeing dark stools for last 2 years which he attributed to his medications.  Since last 1 week he has noted change in his stool color and has been more dark / black color.  Denies any bright red blood per rectum.  Complaining of increased flatus but denies any abdominal pain.  Denies reflux, dysphagia odynophagia.  Last dose of Plavix on Tuesday.  Colonoscopy  10 years ago.  No previous EGD.   Past Medical History:  Diagnosis Date  . Anemia   . Arthritis   . Coronary artery disease    a. 12/2012 Cath/PTCA: LM nl, LAD 32m, LCX nl, RCA 30d, RPDA 99 (PTCA only - 1.5-58mm vessel), EF 55%, subtle apical HK.  [name] ESRD (end stage renal disease) ([place])    Patient started dialysis around 2010.  Pt states the cause of ESRD was lupus and/or HBP.  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 Gets hemodialysis at [facility]) on MWF schedule using L arm AVF.    [name] Herpes zoster   . Hypertension   . Iritis   . Lupus ([place]) 2002  . 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 Peripheral neuropathy   . Pseudoaneurysm ([place])    Enlarged Left   FA    AVF  . Vertigo     Past Surgical History:  Procedure Laterality Date  . AV FISTULA PLACEMENT  05/28/2006   LEFT forearm AVF  . CARDIAC CATHETERIZATION  12/20/2012  . COLONOSCOPY    . CORONARY  ANGIOPLASTY  12/20/2012  . FISTULA SUPERFICIALIZATION Left 2/50/5397   Procedure: PLICATION OF RADIOCEPHALIC ARTERIOVENOUS FISTULA;  Surgeon: Conrad Windy Hills, MD;  Location: [facility];  Service: Vascular;  Laterality: Left;  . FRACTURE SURGERY  Oct. 2009   Right Hip  . LEFT HEART CATHETERIZATION WITH CORONARY ANGIOGRAM N/A 12/20/2012   Procedure: LEFT HEART CATHETERIZATION WITH CORONARY ANGIOGRAM;  Surgeon: Burnell Blanks, MD;  Location: [facility] C[facility] LAB;  Service: Cardiovascular;  Laterality: N/A;  . lymph node biopsy right arm    . VASECTOMY      Prior to Admission medications   Medication Sig Start Date End Date Taking? Authorizing Provider  acetaminophen (TYLENOL) 500 MG tablet Take 500 mg by mouth every 6 (six) hours as needed for mild pain or headache.   Yes [provider]  amLODipine (NORVASC) 5 MG tablet TAKE 1 TABLET BY MOUTH ONCE DAILY 02/02/18  Yes Lyda Jester M, PA-C  atorvastatin (LIPITOR) 40 MG tablet Take 1 tablet (40 mg total) by mouth daily at 6 PM. 03/01/18  Yes McAlhany, Annita Brod, MD  B Complex-C-Folic Acid (RENA-VITE RX) 1 MG TABS Take 1 tablet by mouth 3 (three) times a week. Mon, Wed, Fri. 12/06/17  Yes [provider]  clopidogrel (PLAVIX) 75 MG tablet TAKE 1  TABLET BY MOUTH ONCE DAILY 01/11/18  Yes Lyda Jester M, PA-C  epoetin alfa (EPOGEN,PROCRIT) 4000 UNIT/ML injection Inject 4,000 Units into the vein every 30 (thirty) days. At Dialysis   Yes [provider]  Etelcalcetide HCl 2.5 MG/0.5ML SOLN Inject 2.5 mg into the vein 3 (three) times a week.   Yes [provider]  FOSRENOL 750 MG chewable tablet Chew 750 mg by mouth 3 (three) times daily with meals.  09/28/15  Yes [provider]  gabapentin (NEURONTIN) 100 MG capsule Take 100-200 mg by mouth at bedtime.   Yes [provider]  hydroxychloroquine (PLAQUENIL) 200 MG tablet Take 200 mg by mouth daily.    Yes [provider]  predniSONE  (DELTASONE) 5 MG tablet Take 5 mg by mouth daily.    Yes [provider]    Scheduled Meds: . Chlorhexidine Gluconate Cloth  6 each Topical Q0600  . epoetin (EPOGEN/PROCRIT) injection  20,000 Units Subcutaneous Q M,W,F-HD  . pantoprazole (PROTONIX) IV  40 mg Intravenous Q12H   Continuous Infusions: PRN Meds:.  Allergies as of 03/22/2018 - Review Complete 03/22/2018  Allergen Reaction Noted  . Adhesive [tape]  06/25/2013  . Penicillins Other (See Comments) 11/27/2012  . Amitriptyline Rash 11/27/2012  . Fluconazole Rash 05/09/2017    Family History  Problem Relation Age of Onset  . Hypertension Mother   . CAD Father   . Hypertension Father     Social History   Socioeconomic History  . Marital status: Married    Spouse name: Not on file  . Number of children: 2  . Years of education: Not on file  . Highest education level: Not on file  Occupational History  . Occupation: Disability  Social Needs  . Financial resource strain: Not on file  . Food insecurity:    Worry: Not on file    Inability: Not on file  . Transportation needs:    Medical: Not on file    Non-medical: Not on file  Tobacco Use  . Smoking status: Never Smoker  . Smokeless tobacco: Never Used  Substance and Sexual Activity  . Alcohol use: No  . Drug use: No  . Sexual activity: Not on file  Lifestyle  . Physical activity:    Days per week: Not on file    Minutes per session: Not on file  . Stress: Not on file  Relationships  . Social connections:    Talks on phone: Not on file    Gets together: Not on file    Attends religious service: Not on file    Active member of club or organization: Not on file    Attends meetings of clubs or organizations: Not on file    Relationship status: Not on file  . Intimate partner violence:    Fear of current or ex partner: Not on file    Emotionally abused: Not on file    Physically abused: Not on file    Forced sexual activity: Not on file  Other  Topics Concern  . Not on file  Social History Narrative  . Not on file    Review of Systems: Review of Systems  Constitutional: Negative for chills and fever.  HENT: Negative for hearing loss and tinnitus.   Eyes: Negative for blurred vision and double vision.  Respiratory: Positive for shortness of breath. Negative for cough and hemoptysis.   Cardiovascular: Negative for chest pain and palpitations.  Gastrointestinal: Positive for melena. Negative for abdominal pain, heartburn,  nausea and vomiting.  Genitourinary: Negative for dysuria and urgency.  Musculoskeletal: Positive for joint pain and myalgias.  Skin: Negative for itching and rash.  Neurological: Negative for seizures and loss of consciousness.  Endo/Heme/Allergies: Bruises/bleeds easily.  Psychiatric/Behavioral: Negative for hallucinations and substance abuse.    Physical Exam: Vital signs: Vitals:   03/22/18 2307 03/23/18 0603  BP:  (!) 104/56  Pulse:  77  Resp:  18  Temp: 98.3 F (36.8 C) 97.9 F (36.6 C)  SpO2:  100%   Last BM Date: 03/22/18 Physical Exam  Constitutional: He is oriented to person, place, and time. He appears well-developed and well-nourished. No distress.  HENT:  Head: Normocephalic and atraumatic.  Mouth/Throat: Oropharynx is clear and moist. No oropharyngeal exudate.  Eyes: EOM are normal. No scleral icterus.  Neck: Normal range of motion. Neck supple.  Cardiovascular: Normal rate and regular rhythm.  Pulmonary/Chest: Effort normal and breath sounds normal. No respiratory distress.  Abdominal: Soft. Bowel sounds are normal. He exhibits no distension. There is no abdominal tenderness. There is no rebound and no guarding.  Musculoskeletal: Normal range of motion.        General: No edema.  Neurological: He is alert and oriented to person, place, and time.  Skin: Skin is warm. No erythema.  Psychiatric: He has a normal mood and affect. Judgment and thought content normal.  Vitals  reviewed.   GI:  Lab Results: Recent Labs    03/22/18 1640 03/23/18 0800  WBC 5.0 4.4  HGB 6.8* 5.9*  HCT 22.5* 19.2*  PLT 127* PENDING   BMET Recent Labs    03/22/18 1640  NA 139  K 5.3*  CL 96*  CO2 31  GLUCOSE 93  BUN 44*  CREATININE 7.78*  CALCIUM 9.0   LFT Recent Labs    03/22/18 1640  PROT 6.1*  ALBUMIN 3.0*  AST 22  ALT 22  ALKPHOS 53  BILITOT 0.6  BILIDIR <0.1  IBILI NOT CALCULATED   PT/INR Recent Labs    03/22/18 1640  LABPROT 13.8  INR 1.1     Studies/Results: No results found.  Impression/Plan: -Symptomatic anemia.  Hemoglobin down to 5.9  Hemoglobin was around 12 in February 2020 based on care everywhere review. -Dark stools.  Could be melena.  Occult blood positive.?? AVMs  - CAD  Plavix on hold since Tuesday - End Stage renal disease.  On dialysis   Recommendations ------------------------ -Patient continues to decline blood transfusion. -Neurologist planning for IV iron infusion during dialysis. -Continue IV twice daily PPI. - I will D/W Dr. Therisa Doyne regarding timing for EGD. (Today or tomorrow.) -May need inpatient colonoscopy if EGD negative. GI will follow.   LOS: 1 day   Otis Brace  MD, FACP 03/23/2018, 9:17 AM  Contact #  [phone]

## 2018-03-23 NOTE — Anesthesia Preprocedure Evaluation (Addendum)
Anesthesia Evaluation  Patient identified by MRN, date of birth, ID band Patient awake    Reviewed: Allergy & Precautions, NPO status , Patient's Chart, lab work & pertinent test results  Airway Mallampati: I  TM Distance: >3 FB Neck ROM: Full    Dental  (+) Edentulous Upper, Dental Advisory Given   Pulmonary    breath sounds clear to auscultation       Cardiovascular hypertension, Pt. on medications + angina + CAD   Rhythm:Regular Rate:Normal     Neuro/Psych    GI/Hepatic negative GI ROS, Neg liver ROS,   Endo/Other    Renal/GU ESRFRenal disease     Musculoskeletal  (+) Arthritis ,   Abdominal Normal abdominal exam  (+)   Peds  Hematology  (+) anemia , JEHOVAH'S WITNESS  Anesthesia Other Findings   Reproductive/Obstetrics negative OB ROS                            Anesthesia Physical Anesthesia Plan  ASA: IV  Anesthesia Plan: MAC   Post-op Pain Management:    Induction: Intravenous  PONV Risk Score and Plan: Propofol infusion  Airway Management Planned: Nasal Cannula and Natural Airway  Additional Equipment: None  Intra-op Plan:   Post-operative Plan:   Informed Consent: I have reviewed the patients History and Physical, chart, labs and discussed the procedure including the risks, benefits and alternatives for the proposed anesthesia with the patient or authorized representative who has indicated his/her understanding and acceptance.       Plan Discussed with: CRNA  Anesthesia Plan Comments:        Anesthesia Quick Evaluation

## 2018-03-23 NOTE — Progress Notes (Signed)
PROGRESS NOTE        PATIENT DETAILS Name: Stanley Stewart Age: 63 y.o. Sex: male Date of Birth: 12/08/1955 Admit Date: 03/22/2018 Admitting Physician Albertine Patricia, MD KZS:WFUXNA, Denton Ar, MD  Brief Narrative: Patient is a 63 y.o. male with history of ESRD on HD, CAD on Plavix evaluation of upper GI bleeding and acute blood loss anemia.  Patient is a [employer] will refuse all blood products even in a life-threatening situation.  See below for further details  Subjective: Had dark-colored stools yesterday evening.  Denies any chest pain, shortness of breath, abdominal pain.  Assessment/Plan: Upper GI bleeding with acute blood loss anemia: Continue PPI-he is a Jehovah's Witness and at this point is refusing all blood products even in a life-threatening situation.  GI following-we will await recommendations regarding timing of EGD.  Last dose of Plavix per patient was this past Tuesday.  ESRD on HD MWF: Nephrology following-defer HD to nephrology.  CAD: No anginal symptoms-continue to hold Plavix-continue statin.  HTN: Blood pressure currently controlled-resume antihypertensives when able  SLE: Resume Plaquenil and steroids.  Jehovah's Witness:will refuse all blood products even in a life-threatening situation  DVT Prophylaxis: SCD's  Code Status: Full code   Family Communication: None at bedside  Disposition Plan: Remain inpatient  Antimicrobial agents: Anti-infectives (From admission, onward)   None     Procedures: None  CONSULTS:  GI  Time spent: 25- minutes-Greater than 50% of this time was spent in counseling, explanation of diagnosis, planning of further management, and coordination of care.  MEDICATIONS: Scheduled Meds: . [MAR Hold] Chlorhexidine Gluconate Cloth  6 each Topical Q0600  . [MAR Hold] epoetin (EPOGEN/PROCRIT) injection  20,000 Units Subcutaneous Q M,W,F-HD  . [MAR Hold] pantoprazole (PROTONIX)  IV  40 mg Intravenous Q12H   Continuous Infusions: . sodium chloride 20 mL/hr at 03/23/18 1159   PRN Meds:.   PHYSICAL EXAM: Vital signs: Vitals:   03/22/18 2305 03/22/18 2307 03/23/18 0603 03/23/18 1157  BP: (!) 120/56  (!) 104/56 (!) 143/53  Pulse: 79  77 85  Resp: 18  18 12   Temp:  98.3 F (36.8 C) 97.9 F (36.6 C) 98.8 F (37.1 C)  TempSrc:   Oral Oral  SpO2:   100% 100%  Weight:      Height:       Filed Weights   03/22/18 1508  Weight: 57.2 kg   Body mass index is 18.62 kg/m.   General appearance :Awake, alert, not in any distress.  HEENT: Atraumatic and Normocephalic Neck: supple, no JVD.  Resp:Good air entry bilaterally, no added sounds  CVS: S1 S2 regular, no murmurs.  GI: Bowel sounds present, Non tender and not distended with no gaurding, rigidity or rebound.No organomegaly Extremities: B/L Lower Ext shows no edema, both legs are warm to touch Neurology:  speech clear,Non focal, sensation is grossly intact. Musculoskeletal:No digital cyanosis Skin:No Rash, warm and dry Wounds:N/A  I have personally reviewed following labs and imaging studies  LABORATORY DATA: CBC: Recent Labs  Lab 03/22/18 1640 03/23/18 0800  WBC 5.0 4.4  HGB 6.8* 5.9*  HCT 22.5* 19.2*  MCV 78.9* 78.4*  PLT 127* 108*    Basic Metabolic Panel: Recent Labs  Lab 03/22/18 1640  NA 139  K 5.3*  CL 96*  CO2 31  GLUCOSE 93  BUN 44*  CREATININE 7.78*  CALCIUM 9.0    GFR: Estimated Creatinine Clearance: 8.2 mL/min (A) (by C-G formula based on SCr of 7.78 mg/dL (H)).  Liver Function Tests: Recent Labs  Lab 03/22/18 1640  AST 22  ALT 22  ALKPHOS 53  BILITOT 0.6  PROT 6.1*  ALBUMIN 3.0*   No results for input(s): LIPASE, AMYLASE in the last 168 hours. No results for input(s): AMMONIA in the last 168 hours.  Coagulation Profile: Recent Labs  Lab 03/22/18 1640  INR 1.1    Cardiac Enzymes: No results for input(s): CKTOTAL, CKMB, CKMBINDEX, TROPONINI in the  last 168 hours.  BNP (last 3 results) No results for input(s): PROBNP in the last 8760 hours.  HbA1C: No results for input(s): HGBA1C in the last 72 hours.  CBG: No results for input(s): GLUCAP in the last 168 hours.  Lipid Profile: No results for input(s): CHOL, HDL, LDLCALC, TRIG, CHOLHDL, LDLDIRECT in the last 72 hours.  Thyroid Function Tests: No results for input(s): TSH, T4TOTAL, FREET4, T3FREE, THYROIDAB in the last 72 hours.  Anemia Panel: Recent Labs    03/22/18 1640  VITAMINB12 592  FOLATE 26.2  FERRITIN 1,223*  TIBC 200*  IRON 42*  RETICCTPCT 1.8    Urine analysis: No results found for: COLORURINE, APPEARANCEUR, LABSPEC, PHURINE, GLUCOSEU, HGBUR, BILIRUBINUR, KETONESUR, PROTEINUR, UROBILINOGEN, NITRITE, LEUKOCYTESUR  Sepsis Labs: Lactic Acid, Venous No results found for: LATICACIDVEN  MICROBIOLOGY: Recent Results (from the past 240 hour(s))  MRSA PCR Screening     Status: None   Collection Time: 03/22/18  4:35 PM  Result Value Ref Range Status   MRSA by PCR NEGATIVE NEGATIVE Final    Comment:        The GeneXpert MRSA Assay (FDA approved for NASAL specimens only), is one component of a comprehensive MRSA colonization surveillance program. It is not intended to diagnose MRSA infection nor to guide or monitor treatment for MRSA infections. Performed at [facility], [place] [address], [place], [place] [address]     RADIOLOGY STUDIES/RESULTS: No results found.   LOS: 1 day   Oren Binet, MD  [facility]  If 7PM-7AM, please contact night-coverage  Please page via [email or web address]  Go to [email or web address] and use [place]'s universal password to access. If you do not have the password, please contact the hospital operator.  Locate the [facility] provider you are looking for under [facility] and page to a number that you can be directly reached. If you still have difficulty reaching the provider, please page the [facility] (Director on Call)  for the Hospitalists listed on amion for assistance.  03/23/2018, 12:50 PM

## 2018-03-23 NOTE — Progress Notes (Signed)
[facility] Progress Note  Subjective: no new c/o's.   Vitals:   03/23/18 1308 03/23/18 1314 03/23/18 1315 03/23/18 1318  BP:  (!) 120/37  (!) 136/42  Pulse: 84 85  79  Resp:  (!) 22  16  Temp: 98.4 F (36.9 C)     TempSrc: Oral Oral    SpO2:  97% 97% 98%  Weight:      Height:        Inpatient medications: . Chlorhexidine Gluconate Cloth  6 each Topical Q0600  . epoetin (EPOGEN/PROCRIT) injection  20,000 Units Subcutaneous Q M,W,F-HD  . hydroxychloroquine  200 mg Oral Daily  . pantoprazole (PROTONIX) IV  40 mg Intravenous Q12H  . polyethylene glycol-electrolytes  4,000 mL Oral Once  . predniSONE  5 mg Oral Daily      Iron/TIBC/Ferritin/ %Sat    Component Value Date/Time   IRON 42 (L) 03/22/2018 1640   TIBC 200 (L) 03/22/2018 1640   FERRITIN 1,223 (H) 03/22/2018 1640   IRONPCTSAT 21 03/22/2018 1640    Exam: General:Chronically ill appearing, very pleasant AA male in NAD Head: Normocephalic, atraumatic, sclera non-icteric, mucus membranes are moist Neck: Supple. JVD not elevated. Lungs: Clear bilaterally to auscultation without wheezes, rales, or rhonchi. Breathing is unlabored. Heart: RRR with S1 S2. No murmurs, rubs, or gallops appreciated. Abdomen: Soft, non-tender, non-distended with normoactive bowel sounds. No rebound/guarding. No obvious abdominal masses. M-S:Arthritic changes to hands and feet. Lower extremities:without edema or ischemic changes, no open wounds  Neuro: Alert and oriented X 3. Moves all extremities spontaneously. Psych: Responds to questions appropriately with a normal affect. Dialysis Access:L AVF aneurysmal, + bruit.   Home meds:  - amlodipine 5 qd  - atorvastatin 40 qhs/ clopidogrel 75 qd  - hydroxychloroquine 200 qd/ prednisone 5 mg qd  - fosrenol 1 gm tid ac/ famotidine 20mg  prn heartburn  Dialysis:[facility] MWF 3 hr 15 min 180 NRe 400/Autoflow 1.5 57.5 kg 2.0 K/2.0 Ca UFP 3  L AVF -Heparin 1000 units IV  TIW -Hectorol 6 mcg IV TIW -Parsabiv 10 mg IV TIW -Mircera 200 mcg IV q 2 weeks (last dose Mircera 30 mcg IV 03/12/18 Last HGB 7.1 03/21/18)   Assessment/Plan: 1. ABLA-possible GIB-per primary. GI consulted. HGB 5.9. Patient is Jehovah Witness-does not accept blood products. Epogen ordered with HD. 2. ESRD - MWF. HD today on schedule. No heparin. 3. Hypertension/volume - BP controlled, no evidence of volume overload by exam. Adm wt 57.2 kg. UFG 0.5-1 liters. 4. Anemia - As noted above. 5. Metabolic bone disease - Ca 9.0 C Ca 9.8. Hold Velphoro binders-use Forenol while in hospital. Hermina Staggers not available on hospital formulary. Continue VDRA, 6. Nutrition - Cl liquid diet. Albumin 3.0 Renal diet, prostat renal vit when eating.  7. SLE-per primary 8. CAD-seen by cards for clearance to hold plavix. No chest pain.    [facility] 03/23/2018, 1:38 PM    Recent Labs  Lab 03/22/18 1640  NA 139  K 5.3*  CL 96*  CO2 31  GLUCOSE 93  BUN 44*  CREATININE 7.78*  CALCIUM 9.0  ALBUMIN 3.0*  INR 1.1   Recent Labs  Lab 03/22/18 1640  AST 22  ALT 22  ALKPHOS 53  BILITOT 0.6  PROT 6.1*   Recent Labs  Lab 03/22/18 1640 03/23/18 0800  WBC 5.0 4.4  HGB 6.8* 5.9*  HCT 22.5* 19.2*  MCV 78.9* 78.4*  PLT 127* 108*

## 2018-03-23 NOTE — Op Note (Addendum)
[facility] Patient Name: Stanley Stewart Procedure Date : 03/23/2018 MRN: [id number] Attending MD: Otis Brace , MD Date of Birth: 1955-05-02 CSN: [id number] Age: 63 Admit Type: Inpatient Procedure:                Upper GI endoscopy Indications:              Melena, Gastrointestinal bleeding of unknown origin Providers:                Otis Brace, MD, Zenon Mayo, RN, Cletis Athens, Technician Referring MD:              Medicines:                Sedation Administered by an Anesthesia Professional Complications:            No immediate complications. Estimated Blood Loss:     Estimated blood loss was minimal. Procedure:                Pre-Anesthesia Assessment:                           - Prior to the procedure, a History and Physical                            was performed, and patient medications and                            allergies were reviewed. The patient's tolerance of                            previous anesthesia was also reviewed. The risks                            and benefits of the procedure and the sedation                            options and risks were discussed with the patient.                            All questions were answered, and informed consent                            was obtained. Prior Anticoagulants: The patient has                            taken Plavix (clopidogrel), last dose was 3 days                            prior to procedure. ASA Grade Assessment: IV - A                            patient with severe systemic disease that is a  constant threat to life. After reviewing the risks                            and benefits, the patient was deemed in                            satisfactory condition to undergo the procedure.                           After obtaining informed consent, the endoscope was                            passed under direct vision.  Throughout the                            procedure, the patient's blood pressure, pulse, and                            oxygen saturations were monitored continuously. The                            [id number]-H190 ([id number]) Olympus gastroscope was                            introduced through the mouth, and advanced to the                            third part of duodenum. The upper GI endoscopy was                            accomplished without difficulty. The patient                            tolerated the procedure well. Scope In: Scope Out: Findings:      The Z-line was regular and was found 40 cm from the incisors.      Two medium-sized vascular blebs with a localized distribution were found       in the upper third of the esophagus.      Patchy hemorrhagic mucosa with bleeding was found in the gastric       body.(Could be from scope trauma. Those lesions were not seen during       insertion of endoscopy) Fulguration to stop the bleeding by argon plasma       was successful.      The cardia and gastric fundus were normal on retroflexion.      Diffuse mild inflammation characterized by congestion (edema) and       friability was found in the gastric body. Biopsies were taken with a       cold forceps for histology.      The duodenal bulb, first portion of the duodenum, second portion of the       duodenum and third portion of the duodenum were normal. Impression:               - Z-line regular, 40 cm from the incisors.                           -  Blebs found in the esophagus.                           - Hemorrhagic gastropathy. Treated with argon                            plasma coagulation (APC).                           - Gastritis. Biopsied.                           - Normal duodenal bulb, first portion of the                            duodenum, second portion of the duodenum and third                            portion of the duodenum. Recommendation:           - Return  patient to hospital ward for ongoing care.                           - Clear liquid diet.                           - Continue present medications.                           - Perform a colonoscopy tomorrow. Procedure Code(s):        --- Professional ---                           [phone], 53, Esophagogastroduodenoscopy, flexible,                            transoral; with control of bleeding, any method                           43239, Esophagogastroduodenoscopy, flexible,                            transoral; with biopsy, single or multiple Diagnosis Code(s):        --- Professional ---                           K22.8, Other specified diseases of esophagus                           K92.2, Gastrointestinal hemorrhage, unspecified                           K29.70, Gastritis, unspecified, without bleeding                           K92.1, Melena (includes Hematochezia) CPT copyright 2018 American Medical Association. All rights reserved. The codes documented in this report are preliminary and upon  coder review may  be revised to meet current compliance requirements. Otis Brace, MD Otis Brace, MD 03/23/2018 1:07:54 PM Number of Addenda: 0

## 2018-03-23 NOTE — Consult Note (Signed)
Primary Care Physician:  [name], MD Primary Gastroenterologist:  Dr. Paulita Fujita   Reason for Consultation:   Anemia  HPI: Stanley Stewart is a 63 y.o. male with past medical history of end-stage renal disease on dialysis, history of lupus, history of coronary artery disease currently on Plavix was seen in the GI clinic.  He was found to have anemia.  Patient is Jehovah's Witness.  Patient with multiple medical comorbidities.  He was advised to get admitted to the hospital for further management and possible EGD and colonoscopy as an inpatient.  Patient seen and examined at bedside.  He has been seeing dark stools for last 2 years which he attributed to his medications.  Since last 1 week he has noted change in his stool color and has been more dark / black color.  Denies any bright red blood per rectum.  Complaining of increased flatus but denies any abdominal pain.  Denies reflux, dysphagia odynophagia.  Last dose of Plavix on Tuesday.  Colonoscopy  10 years ago.  No previous EGD.   Past Medical History:  Diagnosis Date  . Anemia   . Arthritis   . Coronary artery disease    a. 12/2012 Cath/PTCA: LM nl, LAD 52m, LCX nl, RCA 30d, RPDA 99 (PTCA only - 1.5-73mm vessel), EF 55%, subtle apical HK.  [name] ESRD (end stage renal disease) ([place])    Patient started dialysis around 2010.  Pt states the cause of ESRD was lupus and/or HBP.  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 Gets hemodialysis at [facility]) on MWF schedule using L arm AVF.    [name] Herpes zoster   . Hypertension   . Iritis   . Lupus ([place]) 2002  . 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 Peripheral neuropathy   . Pseudoaneurysm ([place])    Enlarged Left   FA    AVF  . Vertigo     Past Surgical History:  Procedure Laterality Date  . AV FISTULA PLACEMENT  05/28/2006   LEFT forearm AVF  . CARDIAC CATHETERIZATION  12/20/2012  . COLONOSCOPY    . CORONARY  ANGIOPLASTY  12/20/2012  . FISTULA SUPERFICIALIZATION Left 8/58/8502   Procedure: PLICATION OF RADIOCEPHALIC ARTERIOVENOUS FISTULA;  Surgeon: Conrad Lengby, MD;  Location: [facility];  Service: Vascular;  Laterality: Left;  . FRACTURE SURGERY  Oct. 2009   Right Hip  . LEFT HEART CATHETERIZATION WITH CORONARY ANGIOGRAM N/A 12/20/2012   Procedure: LEFT HEART CATHETERIZATION WITH CORONARY ANGIOGRAM;  Surgeon: Burnell Blanks, MD;  Location: [facility] C[facility] LAB;  Service: Cardiovascular;  Laterality: N/A;  . lymph node biopsy right arm    . VASECTOMY      Prior to Admission medications   Medication Sig Start Date End Date Taking? Authorizing Provider  acetaminophen (TYLENOL) 500 MG tablet Take 500 mg by mouth every 6 (six) hours as needed for mild pain or headache.   Yes [provider]  amLODipine (NORVASC) 5 MG tablet TAKE 1 TABLET BY MOUTH ONCE DAILY 02/02/18  Yes Lyda Jester M, PA-C  atorvastatin (LIPITOR) 40 MG tablet Take 1 tablet (40 mg total) by mouth daily at 6 PM. 03/01/18  Yes McAlhany, Annita Brod, MD  B Complex-C-Folic Acid (RENA-VITE RX) 1 MG TABS Take 1 tablet by mouth 3 (three) times a week. Mon, Wed, Fri. 12/06/17  Yes [provider]  clopidogrel (PLAVIX) 75 MG tablet TAKE 1  TABLET BY MOUTH ONCE DAILY 01/11/18  Yes Lyda Jester M, PA-C  epoetin alfa (EPOGEN,PROCRIT) 4000 UNIT/ML injection Inject 4,000 Units into the vein every 30 (thirty) days. At Dialysis   Yes [provider]  Etelcalcetide HCl 2.5 MG/0.5ML SOLN Inject 2.5 mg into the vein 3 (three) times a week.   Yes [provider]  FOSRENOL 750 MG chewable tablet Chew 750 mg by mouth 3 (three) times daily with meals.  09/28/15  Yes [provider]  gabapentin (NEURONTIN) 100 MG capsule Take 100-200 mg by mouth at bedtime.   Yes [provider]  hydroxychloroquine (PLAQUENIL) 200 MG tablet Take 200 mg by mouth daily.    Yes [provider]  predniSONE  (DELTASONE) 5 MG tablet Take 5 mg by mouth daily.    Yes [provider]    Scheduled Meds: . Chlorhexidine Gluconate Cloth  6 each Topical Q0600  . epoetin (EPOGEN/PROCRIT) injection  20,000 Units Subcutaneous Q M,W,F-HD  . pantoprazole (PROTONIX) IV  40 mg Intravenous Q12H   Continuous Infusions: PRN Meds:.  Allergies as of 03/22/2018 - Review Complete 03/22/2018  Allergen Reaction Noted  . Adhesive [tape]  06/25/2013  . Penicillins Other (See Comments) 11/27/2012  . Amitriptyline Rash 11/27/2012  . Fluconazole Rash 05/09/2017    Family History  Problem Relation Age of Onset  . Hypertension Mother   . CAD Father   . Hypertension Father     Social History   Socioeconomic History  . Marital status: Married    Spouse name: Not on file  . Number of children: 2  . Years of education: Not on file  . Highest education level: Not on file  Occupational History  . Occupation: Disability  Social Needs  . Financial resource strain: Not on file  . Food insecurity:    Worry: Not on file    Inability: Not on file  . Transportation needs:    Medical: Not on file    Non-medical: Not on file  Tobacco Use  . Smoking status: Never Smoker  . Smokeless tobacco: Never Used  Substance and Sexual Activity  . Alcohol use: No  . Drug use: No  . Sexual activity: Not on file  Lifestyle  . Physical activity:    Days per week: Not on file    Minutes per session: Not on file  . Stress: Not on file  Relationships  . Social connections:    Talks on phone: Not on file    Gets together: Not on file    Attends religious service: Not on file    Active member of club or organization: Not on file    Attends meetings of clubs or organizations: Not on file    Relationship status: Not on file  . Intimate partner violence:    Fear of current or ex partner: Not on file    Emotionally abused: Not on file    Physically abused: Not on file    Forced sexual activity: Not on file  Other  Topics Concern  . Not on file  Social History Narrative  . Not on file    Review of Systems: Review of Systems  Constitutional: Negative for chills and fever.  HENT: Negative for hearing loss and tinnitus.   Eyes: Negative for blurred vision and double vision.  Respiratory: Positive for shortness of breath. Negative for cough and hemoptysis.   Cardiovascular: Negative for chest pain and palpitations.  Gastrointestinal: Positive for melena. Negative for abdominal pain, heartburn,  nausea and vomiting.  Genitourinary: Negative for dysuria and urgency.  Musculoskeletal: Positive for joint pain and myalgias.  Skin: Negative for itching and rash.  Neurological: Negative for seizures and loss of consciousness.  Endo/Heme/Allergies: Bruises/bleeds easily.  Psychiatric/Behavioral: Negative for hallucinations and substance abuse.    Physical Exam: Vital signs: Vitals:   03/22/18 2307 03/23/18 0603  BP:  (!) 104/56  Pulse:  77  Resp:  18  Temp: 98.3 F (36.8 C) 97.9 F (36.6 C)  SpO2:  100%   Last BM Date: 03/22/18 Physical Exam  Constitutional: He is oriented to person, place, and time. He appears well-developed and well-nourished. No distress.  HENT:  Head: Normocephalic and atraumatic.  Mouth/Throat: Oropharynx is clear and moist. No oropharyngeal exudate.  Eyes: EOM are normal. No scleral icterus.  Neck: Normal range of motion. Neck supple.  Cardiovascular: Normal rate and regular rhythm.  Pulmonary/Chest: Effort normal and breath sounds normal. No respiratory distress.  Abdominal: Soft. Bowel sounds are normal. He exhibits no distension. There is no abdominal tenderness. There is no rebound and no guarding.  Musculoskeletal: Normal range of motion.        General: No edema.  Neurological: He is alert and oriented to person, place, and time.  Skin: Skin is warm. No erythema.  Psychiatric: He has a normal mood and affect. Judgment and thought content normal.  Vitals  reviewed.   GI:  Lab Results: Recent Labs    03/22/18 1640 03/23/18 0800  WBC 5.0 4.4  HGB 6.8* 5.9*  HCT 22.5* 19.2*  PLT 127* PENDING   BMET Recent Labs    03/22/18 1640  NA 139  K 5.3*  CL 96*  CO2 31  GLUCOSE 93  BUN 44*  CREATININE 7.78*  CALCIUM 9.0   LFT Recent Labs    03/22/18 1640  PROT 6.1*  ALBUMIN 3.0*  AST 22  ALT 22  ALKPHOS 53  BILITOT 0.6  BILIDIR <0.1  IBILI NOT CALCULATED   PT/INR Recent Labs    03/22/18 1640  LABPROT 13.8  INR 1.1     Studies/Results: No results found.  Impression/Plan: -Symptomatic anemia.  Hemoglobin down to 5.9  Hemoglobin was around 12 in February 2020 based on care everywhere review. -Dark stools.  Could be melena.  Occult blood positive.?? AVMs  - CAD  Plavix on hold since Tuesday - End Stage renal disease.  On dialysis   Recommendations ------------------------ -Patient continues to decline blood transfusion. -Neurologist planning for IV iron infusion during dialysis. -Continue IV twice daily PPI. - I will D/W Dr. Therisa Doyne regarding timing for EGD. (Today or tomorrow.) -May need inpatient colonoscopy if EGD negative. GI will follow.   LOS: 1 day   Otis Brace  MD, FACP 03/23/2018, 9:17 AM  Contact #  [phone]

## 2018-03-23 NOTE — Transfer of Care (Signed)
Immediate Anesthesia Transfer of Care Note  Patient: Stanley Stewart  Procedure(s) Performed: ESOPHAGOGASTRODUODENOSCOPY (EGD) WITH PROPOFOL (N/A ) BIOPSY  Patient Location: Endoscopy Unit  Anesthesia Type:MAC  Level of Consciousness: awake, alert  and oriented  Airway & Oxygen Therapy: Patient Spontanous Breathing  Post-op Assessment: Report given to RN  Post vital signs: Reviewed and stable  Last Vitals:  Vitals Value Taken Time  BP    Temp 36.9 C 03/23/2018  1:08 PM  Pulse 84 03/23/2018  1:08 PM  Resp    SpO2      Last Pain:  Vitals:   03/23/18 1308  TempSrc: Oral  PainSc:          Complications: No apparent anesthesia complications

## 2018-03-23 NOTE — Consult Note (Deleted)
[facility] Renal Consultation Note    Indication for Consultation:  Management of ESRD/hemodialysis; anemia, hypertension/volume and secondary hyperparathyroidism PCP:  HPI: Stanley Stewart is a 63 y.o. male with ESRD on hemodialysis MWF at [facility]. He has been on hemodialysis since 2008. Patient is Jehovah Witness. PMH: SLE, HTN, CAD, LVH, grade 2 DD with EF 60-65%, Herpes Zoster, arthritis, AOCD, SHPT. He is compliant with HD, never known him to miss HD. Last HD 03/21/2018. Left tx at [facility].   OP HGB 10.7 03/07/2018 fell to 7.5 03/14/2018,  to 6.9 03/19/2018. He was referred to [facility] GI from HD center. Positive stool cards at HD center 03/22/18. He has been admitted for evaluation of GIB with endoscopy planned S/P plavix wash out.   Currently, he has no complaints. Denies bloody or tarry stool but takes velphoro so stool has been dark. Says he's been tired but otherwise no overt complaints. No overt blood loss reported.   Past Medical History:  Diagnosis Date  . Anemia   . Arthritis   . Coronary artery disease    a. 12/2012 Cath/PTCA: LM nl, LAD 31m, LCX nl, RCA 30d, RPDA 99 (PTCA only - 1.5-26mm vessel), EF 55%, subtle apical HK.  [name] ESRD (end stage renal disease) ([place])    Patient started dialysis around 2010.  Pt states the cause of ESRD was lupus and/or HBP.  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 Gets hemodialysis at [facility]) on MWF schedule using L arm AVF.    [name] Herpes zoster   . Hypertension   . Iritis   . Lupus ([place]) 2002  . 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 Peripheral neuropathy   . Pseudoaneurysm ([place])    Enlarged Left   FA    AVF  . Vertigo    Past Surgical History:  Procedure Laterality Date  . AV FISTULA PLACEMENT  05/28/2006   LEFT forearm AVF  . CARDIAC CATHETERIZATION  12/20/2012  . COLONOSCOPY    . CORONARY ANGIOPLASTY  12/20/2012  .  FISTULA SUPERFICIALIZATION Left 0/07/6224   Procedure: PLICATION OF RADIOCEPHALIC ARTERIOVENOUS FISTULA;  Surgeon: Conrad Addison, MD;  Location: [place];  Service: Vascular;  Laterality: Left;  . FRACTURE SURGERY  Oct. 2009   Right Hip  . LEFT HEART CATHETERIZATION WITH CORONARY ANGIOGRAM N/A 12/20/2012   Procedure: LEFT HEART CATHETERIZATION WITH CORONARY ANGIOGRAM;  Surgeon: Burnell Blanks, MD;  Location: [facility] CATH LAB;  Service: Cardiovascular;  Laterality: N/A;  . lymph node biopsy right arm    . VASECTOMY     Family History  Problem Relation Age of Onset  . Hypertension Mother   . CAD Father   . Hypertension Father    Social History:  reports that he has never smoked. He has never used smokeless tobacco. He reports that he does not drink alcohol or use drugs. Allergies  Allergen Reactions  . Adhesive [Tape]     Pulls skin off,  ONLY use paper tape.   [name] Penicillins Other (See Comments)    Patient states that MD told him he was allergic to this med but he does not recall having any reactions   . Amitriptyline Rash  . Fluconazole Rash   Prior to Admission medications   Medication Sig Start Date End Date Taking? Authorizing Provider  acetaminophen (TYLENOL) 500 MG tablet Take 500 mg by mouth every 6 (six)  hours as needed for mild pain or headache.   Yes [provider]  amLODipine (NORVASC) 5 MG tablet TAKE 1 TABLET BY MOUTH ONCE DAILY 02/02/18  Yes Lyda Jester M, PA-C  atorvastatin (LIPITOR) 40 MG tablet Take 1 tablet (40 mg total) by mouth daily at 6 PM. 03/01/18  Yes McAlhany, Annita Brod, MD  B Complex-C-Folic Acid (RENA-VITE RX) 1 MG TABS Take 1 tablet by mouth 3 (three) times a week. Mon, Wed, Fri. 12/06/17  Yes [provider]  clopidogrel (PLAVIX) 75 MG tablet TAKE 1 TABLET BY MOUTH ONCE DAILY 01/11/18  Yes Lyda Jester M, PA-C  epoetin alfa (EPOGEN,PROCRIT) 4000 UNIT/ML injection Inject 4,000 Units into the vein every 30 (thirty) days. At  Dialysis   Yes [provider]  Etelcalcetide HCl 2.5 MG/0.5ML SOLN Inject 2.5 mg into the vein 3 (three) times a week.   Yes [provider]  FOSRENOL 750 MG chewable tablet Chew 750 mg by mouth 3 (three) times daily with meals.  09/28/15  Yes [provider]  gabapentin (NEURONTIN) 100 MG capsule Take 100-200 mg by mouth at bedtime.   Yes [provider]  hydroxychloroquine (PLAQUENIL) 200 MG tablet Take 200 mg by mouth daily.    Yes [provider]  predniSONE (DELTASONE) 5 MG tablet Take 5 mg by mouth daily.    Yes [provider]   Current Facility-Administered Medications  Medication Dose Route Frequency Provider Last Rate Last Dose  . Chlorhexidine Gluconate Cloth 2 % PADS 6 each  6 each Topical Q0600 Roney Jaffe, MD   6 each at 03/23/18 [phone]  . epoetin alfa (EPOGEN,PROCRIT) injection 20,000 Units  20,000 Units Subcutaneous Q M,W,F-HD Roney Jaffe, MD      . pantoprazole (PROTONIX) injection 40 mg  40 mg Intravenous Q12H Elgergawy, Silver Huguenin, MD   40 mg at 03/22/18 2246   Labs: Basic Metabolic Panel: Recent Labs  Lab 03/22/18 1640  NA 139  K 5.3*  CL 96*  CO2 31  GLUCOSE 93  BUN 44*  CREATININE 7.78*  CALCIUM 9.0   Liver Function Tests: Recent Labs  Lab 03/22/18 1640  AST 22  ALT 22  ALKPHOS 53  BILITOT 0.6  PROT 6.1*  ALBUMIN 3.0*   No results for input(s): LIPASE, AMYLASE in the last 168 hours. No results for input(s): AMMONIA in the last 168 hours. CBC: Recent Labs  Lab 03/22/18 1640 03/23/18 0800  WBC 5.0 4.4  HGB 6.8* 5.9*  HCT 22.5* 19.2*  MCV 78.9* 78.4*  PLT 127* PENDING   Cardiac Enzymes: No results for input(s): CKTOTAL, CKMB, CKMBINDEX, TROPONINI in the last 168 hours. CBG: No results for input(s): GLUCAP in the last 168 hours. Iron Studies:  Recent Labs    03/22/18 1640  IRON 42*  TIBC 200*  FERRITIN 1,223*   Studies/Results: No results found.  ROS: As per HPI otherwise  negative.   Physical Exam: Vitals:   03/22/18 1508 03/22/18 2305 03/22/18 2307 03/23/18 0603  BP: (!) 147/62 (!) 120/56  (!) 104/56  Pulse: 80 79  77  Resp: (!) 24 18  18   Temp: 98.3 F (36.8 C)  98.3 F (36.8 C) 97.9 F (36.6 C)  TempSrc: Oral   Oral  SpO2: 97%   100%  Weight: 57.2 kg     Height: 5\' 9"  (1.753 m)        General: Chronically ill appearing, very pleasant AA male in NAD Head: Normocephalic, atraumatic, sclera non-icteric, mucus membranes  are moist Neck: Supple. JVD not elevated. Lungs: Clear bilaterally to auscultation without wheezes, rales, or rhonchi. Breathing is unlabored. Heart: RRR with S1 S2. No murmurs, rubs, or gallops appreciated. Abdomen: Soft, non-tender, non-distended with normoactive bowel sounds. No rebound/guarding. No obvious abdominal masses. M-S:  Arthritic changes to hands and feet. Lower extremities:without edema or ischemic changes, no open wounds  Neuro: Alert and oriented X 3. Moves all extremities spontaneously. Psych:  Responds to questions appropriately with a normal affect. Dialysis Access: L AVF aneurysmal, + bruit.   Dialysis Orders: [facility] MWF 3 hr 15 min 180 NRe 400/Autoflow 1.5 57.5 kg 2.0 K/2.0 Ca UFP 3  L AVF -Heparin 1000 units IV TIW -Hectorol 6 mcg IV TIW -Parsabiv 10 mg IV TIW -Mircera 200 mcg IV q 2 weeks (last dose Mircera 30 mcg IV 03/12/18 Last HGB 7.1 03/21/18)   Assessment/Plan: 1.  ABLA-possible GIB-per primary. GI consulted. HGB 5.9. Patient is Jehovah Witness-does not accept blood products. Epogen ordered with HD.   2.  ESRD - MWF. HD today on schedule. No heparin.  3.  Hypertension/volume  - BP controlled, no evidence of volume overload by exam. Adm wt 57.2 kg. UFG 0.5-1 liters.  4.  Anemia  - As noted above.  5.  Metabolic bone disease - Ca 9.0 C Ca 9.8. Hold Velphoro binders-use Forenol while in hospital. Hermina Staggers not available on hospital formulary. Continue VDRA,  6.  Nutrition - Cl liquid diet. Albumin  3.0 Renal diet, prostat renal vit when eating.  7.  SLE-per primary 8.  CAD-seen by cards for clearance to hold plavix. No chest pain.   Landen Breeland H. Owens Shark, NP-C 03/23/2018, 9:27 AM  [employer] [phone]

## 2018-03-24 ENCOUNTER — Encounter (HOSPITAL_COMMUNITY)
Admission: AD | Disposition: A | Discharge status: Home / Self Care | Payer: Self-pay | Source: Home / Self Care | Attending: Internal Medicine

## 2018-03-24 ENCOUNTER — Encounter (HOSPITAL_COMMUNITY): Payer: Self-pay | Admitting: *Deleted

## 2018-03-24 ENCOUNTER — Inpatient Hospital Stay (HOSPITAL_COMMUNITY): Payer: Medicare Other | Admitting: Certified Registered Nurse Anesthetist

## 2018-03-24 HISTORY — PX: GIVENS CAPSULE STUDY: SHX5432

## 2018-03-24 HISTORY — PX: COLONOSCOPY WITH PROPOFOL: SHX5780

## 2018-03-24 LAB — CBC
HCT: 18.5 % — ABNORMAL LOW (ref 39.0–52.0)
HEMOGLOBIN: 5.7 g/dL — AB (ref 13.0–17.0)
MCH: 24.4 pg — ABNORMAL LOW (ref 26.0–34.0)
MCHC: 30.8 g/dL (ref 30.0–36.0)
MCV: 79.1 fL — ABNORMAL LOW (ref 80.0–100.0)
Platelets: 103 10*3/uL — ABNORMAL LOW (ref 150–400)
RBC: 2.34 MIL/uL — ABNORMAL LOW (ref 4.22–5.81)
RDW: 17.2 % — ABNORMAL HIGH (ref 11.5–15.5)
WBC: 4.5 10*3/uL (ref 4.0–10.5)
nRBC: 0 % (ref 0.0–0.2)

## 2018-03-24 SURGERY — COLONOSCOPY WITH PROPOFOL
Anesthesia: Monitor Anesthesia Care

## 2018-03-24 SURGERY — IMAGING PROCEDURE, GI TRACT, INTRALUMINAL, VIA CAPSULE

## 2018-03-24 MED ORDER — DARBEPOETIN ALFA 25 MCG/0.42ML IJ SOSY: 25.0000 ug | PREFILLED_SYRINGE | INTRAMUSCULAR | Status: DC

## 2018-03-24 MED ORDER — LANTHANUM CARBONATE 500 MG PO CHEW
750.0000 mg | CHEWABLE_TABLET | Freq: Three times a day (TID) | ORAL | Status: DC
  Administered 2018-03-24 – 2018-03-25 (×3): 750 mg via ORAL
  Filled 2018-03-24 (×4): qty 2

## 2018-03-24 MED ORDER — ETELCALCETIDE HCL 2.5 MG/0.5ML IV SOLN: 2.5000 mg | INTRAVENOUS | Status: DC

## 2018-03-24 MED ORDER — SODIUM CHLORIDE 0.9 % IV SOLN
250.0000 mg | Freq: Once | INTRAVENOUS | Status: AC
  Administered 2018-03-24: 250 mg via INTRAVENOUS
  Filled 2018-03-24: qty 20

## 2018-03-24 MED ORDER — PROPOFOL 10 MG/ML IV BOLUS
INTRAVENOUS | Status: DC | PRN
  Administered 2018-03-24: 20 mg via INTRAVENOUS
  Administered 2018-03-24 (×2): 10 mg via INTRAVENOUS
  Administered 2018-03-24: 20 mg via INTRAVENOUS
  Administered 2018-03-24: 10 mg via INTRAVENOUS

## 2018-03-24 MED ORDER — AMLODIPINE BESYLATE 5 MG PO TABS
5.0000 mg | ORAL_TABLET | Freq: Every day | ORAL | Status: DC
  Administered 2018-03-24 – 2018-03-25 (×2): 5 mg via ORAL
  Filled 2018-03-24 (×2): qty 1

## 2018-03-24 MED ORDER — PROPOFOL 500 MG/50ML IV EMUL
INTRAVENOUS | Status: DC | PRN
  Administered 2018-03-24: 100 ug/kg/min via INTRAVENOUS

## 2018-03-24 MED ORDER — RENA-VITE PO TABS: 1.0000 | ORAL_TABLET | ORAL | Status: DC

## 2018-03-24 MED ORDER — SODIUM CHLORIDE 0.9 % IV SOLN
INTRAVENOUS | Status: DC | PRN
  Administered 2018-03-24: 07:00:00 via INTRAVENOUS

## 2018-03-24 MED ORDER — SODIUM CHLORIDE 0.9 % IV SOLN: 125.0000 mg | INTRAVENOUS | Status: DC

## 2018-03-24 MED ORDER — GABAPENTIN 100 MG PO CAPS
100.0000 mg | ORAL_CAPSULE | Freq: Every day | ORAL | Status: DC
  Administered 2018-03-24: 100 mg via ORAL
  Filled 2018-03-24: qty 1

## 2018-03-24 MED ORDER — ACETAMINOPHEN 500 MG PO TABS: 500.0000 mg | ORAL_TABLET | Freq: Four times a day (QID) | ORAL | Status: DC | PRN

## 2018-03-24 MED ORDER — ATORVASTATIN CALCIUM 40 MG PO TABS
40.0000 mg | ORAL_TABLET | Freq: Every day | ORAL | Status: DC
  Administered 2018-03-24: 40 mg via ORAL
  Filled 2018-03-24: qty 1

## 2018-03-24 SURGICAL SUPPLY — 22 items

## 2018-03-24 NOTE — Op Note (Signed)
[facility] Patient Name: Stanley Stewart Procedure Date : 03/24/2018 MRN: [id number] Attending MD: Ronnette Juniper , MD Date of Birth: 05/19/1955 CSN: [id number] Age: 63 Admit Type: Inpatient Procedure:                Colonoscopy Indications:              Last colonoscopy 10 years ago, Iron deficiency                            anemia secondary to chronic blood loss Providers:                Ronnette Juniper, MD, Elna Breslow, RN, Elspeth Cho                            Tech., Technician, Clearnce Sorrel, CRNA Referring MD:              Medicines:                Monitored Anesthesia Care Complications:            No immediate complications. Estimated blood loss:                            Minimal. Estimated Blood Loss:     Estimated blood loss was minimal. Procedure:                Pre-Anesthesia Assessment:                           - Prior to the procedure, a History and Physical                            was performed, and patient medications and                            allergies were reviewed. The patient's tolerance of                            previous anesthesia was also reviewed. The risks                            and benefits of the procedure and the sedation                            options and risks were discussed with the patient.                            All questions were answered, and informed consent                            was obtained. Prior Anticoagulants: The patient has                            taken Plavix (clopidogrel), last dose was 3 days  prior to procedure. ASA Grade Assessment: III - A                            patient with severe systemic disease. After                            reviewing the risks and benefits, the patient was                            deemed in satisfactory condition to undergo the                            procedure.                           After obtaining informed consent, the  colonoscope                            was passed under direct vision. Throughout the                            procedure, the patient's blood pressure, pulse, and                            oxygen saturations were monitored continuously. The                            PCF-H190DL ([id number]) Olympus pediatric colonoscope                            was introduced through the anus and advanced to the                            the terminal ileum. The colonoscopy was performed                            without difficulty. The patient tolerated the                            procedure well. The quality of the bowel                            preparation was good. Scope In: 7:33:59 AM Scope Out: 1:32:44 AM Scope Withdrawal Time: 0 hours 8 minutes 21 seconds  Total Procedure Duration: 0 hours 14 minutes 13 seconds  Findings:      The perianal and digital rectal examinations were normal.      A diffuse area of moderate melanosis was found in the entire colon.      The terminal ileum appeared normal.      A patchy area of mildly friable (with contact bleeding) mucosa was found       in the descending colon, at the hepatic flexure, in the ascending colon       and in the cecum. This could be related to scope trauma and  insufflation, no active bleeding noted otherwise.      Scattered small and large-mouthed diverticula were found in the sigmoid       colon, descending colon, transverse colon and hepatic flexure.      Retroflexion was note performed.      Non-bleeding internal hemorrhoids were found. The hemorrhoids were small. Impression:               - Melanosis in the colon.                           - The examined portion of the ileum was normal.                           - Friable (with contact bleeding) mucosa in the                            descending colon, at the hepatic flexure, in the                            ascending colon and in the cecum.                           -  Diverticulosis in the sigmoid colon, in the                            descending colon, in the transverse colon and at                            the hepatic flexure.                           - Non-bleeding internal hemorrhoids.                           - No specimens collected. Moderate Sedation:      Patient did not receive moderate sedation for this procedure, but       instead received monitored anesthesia care. Recommendation:           - To visualize the small bowel, perform video                            capsule endoscopy today.                           - NPO.                           - Continue present medications. Procedure Code(s):        --- Professional ---                           [id number], Colonoscopy, flexible; diagnostic, including                            collection of specimen(s) by brushing or washing,  when performed (separate procedure) Diagnosis Code(s):        --- Professional ---                           K63.89, Other specified diseases of intestine                           K64.8, Other hemorrhoids                           K92.2, Gastrointestinal hemorrhage, unspecified                           D50.0, Iron deficiency anemia secondary to blood                            loss (chronic)                           K57.30, Diverticulosis of large intestine without                            perforation or abscess without bleeding CPT copyright 2018 American Medical Association. All rights reserved. The codes documented in this report are preliminary and upon coder review may  be revised to meet current compliance requirements. Ronnette Juniper, MD 03/24/2018 7:57:06 AM This report has been signed electronically. Number of Addenda: 0

## 2018-03-24 NOTE — Progress Notes (Addendum)
[facility] Progress Note   Subjective: No new complaints. Capsule study in progress. Wants to change binders to Fosrenol.   Objective Vitals:   03/24/18 0754 03/24/18 0800 03/24/18 0805 03/24/18 0822  BP: (!) 103/34 (!) 118/47 131/62 (!) 120/53  Pulse: 74 83 85 82  Resp: 20 17 17    Temp: 98.1 F (36.7 C)   98 F (36.7 C)  TempSrc: Oral   Oral  SpO2: 100% 100% 100% 99%  Weight:      Height:       Physical Exam General:Chronically ill appearing male in NAD Heart:S1,S2, RRR Lungs: CTAB Abdomen: S, NT Extremities: no LE edema.  Dialysis Access: L AVF aneurysmal, + bruit.    Additional Objective Labs: Basic Metabolic Panel: Recent Labs  Lab 03/22/18 1640 03/24/18 0421  NA 139 136  K 5.3* 4.2  CL 96* 100  CO2 31 26  GLUCOSE 93 79  BUN 44* 16  CREATININE 7.78* 5.00*  CALCIUM 9.0 8.0*  PHOS  --  3.6   Liver Function Tests: Recent Labs  Lab 03/22/18 1640 03/24/18 0421  AST 22  --   ALT 22  --   ALKPHOS 53  --   BILITOT 0.6  --   PROT 6.1*  --   ALBUMIN 3.0* 2.5*   No results for input(s): LIPASE, AMYLASE in the last 168 hours. CBC: Recent Labs  Lab 03/22/18 1640 03/23/18 0800 03/24/18 0421  WBC 5.0 4.4 4.5  HGB 6.8* 5.9* 5.7*  HCT 22.5* 19.2* 18.5*  MCV 78.9* 78.4* 79.1*  PLT 127* 108* 103*   Blood Culture No results found for: SDES, SPECREQUEST, CULT, REPTSTATUS  Cardiac Enzymes: No results for input(s): CKTOTAL, CKMB, CKMBINDEX, TROPONINI in the last 168 hours. CBG: No results for input(s): GLUCAP in the last 168 hours. Iron Studies:  Recent Labs    03/22/18 1640  IRON 42*  TIBC 200*  FERRITIN 1,223*   @lablastinr3 @ Studies/Results: No results found. Medications:  . amLODipine  5 mg Oral Daily  . atorvastatin  40 mg Oral q1800  . Chlorhexidine Gluconate Cloth  6 each Topical Q0600  . epoetin (EPOGEN/PROCRIT) injection  20,000 Units Subcutaneous Q M,W,F-HD  . gabapentin  100-200 mg Oral QHS  .  hydroxychloroquine  200 mg Oral Daily  . lanthanum  750 mg Oral TID WC  . [START ON 03/26/2018] multivitamin  1 tablet Oral Once per day on Mon Wed Fri  . pantoprazole (PROTONIX) IV  40 mg Intravenous Q12H  . predniSONE  5 mg Oral Q lunch     Dialysis:[facility] MWF 3 hr 15 min 180 NRe 400/Autoflow 1.5 57.5 kg 2.0 K/2.0 Ca UFP 3  L AVF -Heparin 1000 units IV TIW -Hectorol 6 mcg IV TIW -Parsabiv 10 mg IV TIW -Mircera 200 mcg IV q 2 weeks (last dose Mircera 30 mcg IV 03/12/18 Last HGB 7.1 03/21/18)   Assessment/Plan: 1. Anemia of ckd +ABLA-possible GIB-per primary. GI consulted. HGB 5.7. Patient is Jehovah Witness-does not accept blood products. Epogen high-dose 20K w/ HD tiw ordered with HD and will start IV Fe load today for Tsat 21%. 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 Colonoscopy today melanosis in colon, friable mucosa ascending colol and cecum, diverticulosis sigm colon. Per primary/GI.  2. ESRD - MWF. HD today on schedule. No heparin. 3. Hypertension/volume - BP controlled, no evidence of volume overload by exam. HD 03/24/18 Pre  wt 58kg Net UF 300cc Post wt 58 kg. No evidence of volume overload.   4.  Anemia - As noted above. 5. Metabolic bone disease - Ca 9.0 C Ca 9.8. Hold Velphoro binders-use Forenol while in hospital. Hermina Staggers not available on hospital formulary. Continue VDRA, 6. Nutrition - Cl liquid diet. Albumin 3.0 Renal diet, prostat renal vit when eating.  7. SLE-per primary 8. CAD-seen by cards for clearance to hold plavix. No chest pain.  Rita H. Brown NP-C 03/24/2018, 10:13 AM  [facility] [phone]  Pt seen, examined and agree w A/P as above.  [facility] 03/24/2018, 12:33 PM

## 2018-03-24 NOTE — Transfer of Care (Signed)
Immediate Anesthesia Transfer of Care Note  Patient: Stanley Stewart  Procedure(s) Performed: COLONOSCOPY WITH PROPOFOL (N/A ) GIVENS CAPSULE STUDY (N/A )  Patient Location: Endoscopy Unit  Anesthesia Type:MAC  Level of Consciousness: awake  Airway & Oxygen Therapy: Patient Spontanous Breathing  Post-op Assessment: Report given to RN and Post -op Vital signs reviewed and stable  Post vital signs: Reviewed and stable  Last Vitals:  Vitals Value Taken Time  BP    Temp    Pulse    Resp 20 03/24/2018  7:53 AM  SpO2    Vitals shown include unvalidated device data.  Last Pain:  Vitals:   03/24/18 0702  TempSrc: Oral  PainSc: 0-No pain         Complications: No apparent anesthesia complications

## 2018-03-24 NOTE — Brief Op Note (Signed)
03/22/2018 - 03/24/2018  7:50 AM  PATIENT:  Stanley Stewart  63 y.o. male  PRE-OPERATIVE DIAGNOSIS:  Anemia  POST-OPERATIVE DIAGNOSIS:  tics, difuse fryable tissue, melanous colitis  PROCEDURE:  Procedure(s): COLONOSCOPY WITH PROPOFOL (N/A) GIVENS CAPSULE STUDY (N/A)  SURGEON:  Surgeon(s) and Role:    Ronnette Juniper, MD - Primary  PHYSICIAN ASSISTANT:   ASSISTANTS:Liz Honeycutt, RN, Elspeth Cho, Tech  ANESTHESIA:   MAC  EBL:  Minimal  BLOOD ADMINISTERED:none  DRAINS: none   LOCAL MEDICATIONS USED:  NONE  SPECIMEN:  No Specimen  DISPOSITION OF SPECIMEN:  N/A  COUNTS:  YES  TOURNIQUET:  * No tourniquets in log *  DICTATION: .Dragon Dictation  PLAN OF CARE: Admit to inpatient   PATIENT DISPOSITION:  PACU - hemodynamically stable.   Delay start of Pharmacological VTE agent (>24hrs) due to surgical blood loss or risk of bleeding: yes

## 2018-03-24 NOTE — Anesthesia Postprocedure Evaluation (Signed)
Anesthesia Post Note  Patient: Stanley Stewart  Procedure(s) Performed: COLONOSCOPY WITH PROPOFOL (N/A ) GIVENS CAPSULE STUDY (N/A )     Patient location during evaluation: Endoscopy Anesthesia Type: MAC Level of consciousness: awake and alert Pain management: pain level controlled Vital Signs Assessment: post-procedure vital signs reviewed and stable Respiratory status: spontaneous breathing, nonlabored ventilation and respiratory function stable Cardiovascular status: stable and blood pressure returned to baseline Postop Assessment: no apparent nausea or vomiting Anesthetic complications: no    Last Vitals:  Vitals:   03/24/18 0805 03/24/18 0822  BP: 131/62 (!) 120/53  Pulse: 85 82  Resp: 17   Temp:  36.7 C  SpO2: 100% 99%    Last Pain:  Vitals:   03/24/18 0822  TempSrc: Oral  PainSc:                  Sevastian Witczak,W. EDMOND

## 2018-03-24 NOTE — Anesthesia Preprocedure Evaluation (Signed)
Anesthesia Evaluation  Patient identified by MRN, date of birth, ID band Patient awake    Reviewed: Allergy & Precautions, H&P , NPO status , Patient's Chart, lab work & pertinent test results  Airway Mallampati: II  TM Distance: >3 FB Neck ROM: Full    Dental no notable dental hx. (+) Teeth Intact, Dental Advisory Given   Pulmonary neg pulmonary ROS,    Pulmonary exam normal breath sounds clear to auscultation       Cardiovascular hypertension, Pt. on medications + CAD   Rhythm:Regular Rate:Normal     Neuro/Psych negative neurological ROS  negative psych ROS   GI/Hepatic negative GI ROS, Neg liver ROS,   Endo/Other  negative endocrine ROS  Renal/GU ESRF and DialysisRenal disease  negative genitourinary   Musculoskeletal  (+) Arthritis ,   Abdominal   Peds  Hematology  (+) Blood dyscrasia, anemia , REFUSES BLOOD PRODUCTS, JEHOVAH'S WITNESS  Anesthesia Other Findings   Reproductive/Obstetrics negative OB ROS                             Anesthesia Physical Anesthesia Plan  ASA: III  Anesthesia Plan: MAC   Post-op Pain Management:    Induction: Intravenous  PONV Risk Score and Plan: 1 and Propofol infusion and Ondansetron  Airway Management Planned: Simple Face Mask  Additional Equipment:   Intra-op Plan:   Post-operative Plan:   Informed Consent: I have reviewed the patients History and Physical, chart, labs and discussed the procedure including the risks, benefits and alternatives for the proposed anesthesia with the patient or authorized representative who has indicated his/her understanding and acceptance.     Dental advisory given  Plan Discussed with: CRNA  Anesthesia Plan Comments:         Anesthesia Quick Evaluation

## 2018-03-24 NOTE — Progress Notes (Signed)
PROGRESS NOTE        PATIENT DETAILS Name: Stanley Stewart Age: 63 y.o. Sex: male Date of Birth: [birth year] Admit Date: 03/22/2018 Admitting Physician Albertine Patricia, MD JSE:[id number], Denton Ar, MD  Brief Narrative: Patient is a 63 y.o. male with history of ESRD on HD, CAD on Plavix evaluation of upper GI bleeding and acute blood loss anemia.  Patient is a [employer] will refuse all blood products even in a life-threatening situation.  See below for further details  Subjective: No melena overnight.  Assessment/Plan: Upper GI bleeding with acute blood loss anemia: Thankfully no melena overnight-EGD on 3/20 showed gastropathy treated with APC-but no other obvious foci of bleeding.  Underwent colonoscopy on 3/21 diverticulosis and some friable mucosa with contact bleeding-forward no obvious bleeding source as well.  Plans are for capsule endoscopy today.  Hemoglobin unchanged compared to yesterday-5.7.  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  Continue IV iron and Aranesp per nephrology  ESRD on HD MWF: Nephrology following-for HD care to nephrology  CAD: No anginal symptoms-due to severity of anemia-continue to hold Plavix.  Last balloon angioplasty was approximately 4 years back.  HTN: Blood pressure currently controlled-resume antihypertensives when able  SLE: Continue Plaquenil and steroids.  Jehovah's Witness:will refuse all blood products even in a life-threatening situation  DVT Prophylaxis: SCD's  Code Status: Full code   Family Communication: None at bedside  Disposition Plan: Remain inpatient  Antimicrobial agents: Anti-infectives (From admission, onward)   Start     Dose/Rate Route Frequency Ordered Stop   03/23/18 1300  hydroxychloroquine (PLAQUENIL) tablet 200 mg     200 mg Oral Daily 03/23/18 1254       Procedures: None  CONSULTS:  GI  Time spent: 25- minutes-Greater than 50%  of this time was spent in counseling, explanation of diagnosis, planning of further management, and coordination of care.  MEDICATIONS: Scheduled Meds: . amLODipine  5 mg Oral Daily  . atorvastatin  40 mg Oral q1800  . Chlorhexidine Gluconate Cloth  6 each Topical Q0600  . epoetin (EPOGEN/PROCRIT) injection  20,000 Units Subcutaneous Q M,W,F-HD  . gabapentin  100-200 mg Oral QHS  . hydroxychloroquine  200 mg Oral Daily  . lanthanum  750 mg Oral TID WC  . [START ON 03/26/2018] multivitamin  1 tablet Oral Once per day on Mon Wed Fri  . pantoprazole (PROTONIX) IV  40 mg Intravenous Q12H  . predniSONE  5 mg Oral Q lunch   Continuous Infusions: . [START ON 03/26/2018] ferric gluconate (FERRLECIT/NULECIT) IV    . ferric gluconate (FERRLECIT/NULECIT) IV     PRN Meds:.   PHYSICAL EXAM: Vital signs: Vitals:   03/24/18 0800 03/24/18 0805 03/24/18 0822 03/24/18 1014  BP: (!) 118/47 131/62 (!) 120/53 (!) 123/51  Pulse: 83 85 82   Resp: 17 17    Temp:   98 F (36.7 C)   TempSrc:   Oral   SpO2: 100% 100% 99%   Weight:      Height:       Filed Weights   03/22/18 1508 03/23/18 1546 03/23/18 1901  Weight: 57.2 kg 58 kg 58 kg   Body mass index is 18.88 kg/m.   General appearance:Awake, alert, not in any distress.  Eyes:no scleral icterus. HEENT: Atraumatic and Normocephalic Neck: supple, no JVD. Resp:Good  air entry bilaterally,no rales or rhonchi CVS: S1 S2 regular, no murmurs.  GI: Bowel sounds present, Non tender and not distended with no gaurding, rigidity or rebound. Extremities: B/L Lower Ext shows no edema, both legs are warm to touch Neurology:  Non focal Psychiatric: Normal judgment and insight. Normal mood. Musculoskeletal:No digital cyanosis Skin:No Rash, warm and dry Wounds:N/A  I have personally reviewed following labs and imaging studies  LABORATORY DATA: CBC: Recent Labs  Lab 03/22/18 1640 03/23/18 0800 03/24/18 0421  WBC 5.0 4.4 4.5  HGB 6.8* 5.9* 5.7*   HCT 22.5* 19.2* 18.5*  MCV 78.9* 78.4* 79.1*  PLT 127* 108* 103*    Basic Metabolic Panel: Recent Labs  Lab 03/22/18 1640 03/24/18 0421  NA 139 136  K 5.3* 4.2  CL 96* 100  CO2 31 26  GLUCOSE 93 79  BUN 44* 16  CREATININE 7.78* 5.00*  CALCIUM 9.0 8.0*  PHOS  --  3.6    GFR: Estimated Creatinine Clearance: 12.9 mL/min (A) (by C-G formula based on SCr of 5 mg/dL (H)).  Liver Function Tests: Recent Labs  Lab 03/22/18 1640 03/24/18 0421  AST 22  --   ALT 22  --   ALKPHOS 53  --   BILITOT 0.6  --   PROT 6.1*  --   ALBUMIN 3.0* 2.5*   No results for input(s): LIPASE, AMYLASE in the last 168 hours. No results for input(s): AMMONIA in the last 168 hours.  Coagulation Profile: Recent Labs  Lab 03/22/18 1640  INR 1.1    Cardiac Enzymes: No results for input(s): CKTOTAL, CKMB, CKMBINDEX, TROPONINI in the last 168 hours.  BNP (last 3 results) No results for input(s): PROBNP in the last 8760 hours.  HbA1C: No results for input(s): HGBA1C in the last 72 hours.  CBG: No results for input(s): GLUCAP in the last 168 hours.  Lipid Profile: No results for input(s): CHOL, HDL, LDLCALC, TRIG, CHOLHDL, LDLDIRECT in the last 72 hours.  Thyroid Function Tests: No results for input(s): TSH, T4TOTAL, FREET4, T3FREE, THYROIDAB in the last 72 hours.  Anemia Panel: Recent Labs    03/22/18 1640  VITAMINB12 592  FOLATE 26.2  FERRITIN 1,223*  TIBC 200*  IRON 42*  RETICCTPCT 1.8    Urine analysis: No results found for: COLORURINE, APPEARANCEUR, LABSPEC, PHURINE, GLUCOSEU, HGBUR, BILIRUBINUR, KETONESUR, PROTEINUR, UROBILINOGEN, NITRITE, LEUKOCYTESUR  Sepsis Labs: Lactic Acid, Venous No results found for: LATICACIDVEN  MICROBIOLOGY: Recent Results (from the past 240 hour(s))  MRSA PCR Screening     Status: None   Collection Time: 03/22/18  4:35 PM  Result Value Ref Range Status   MRSA by PCR NEGATIVE NEGATIVE Final    Comment:        The GeneXpert MRSA Assay  (FDA approved for NASAL specimens only), is one component of a comprehensive MRSA colonization surveillance program. It is not intended to diagnose MRSA infection nor to guide or monitor treatment for MRSA infections. Performed at [facility], [place] [address] [place], [place] [address]     RADIOLOGY STUDIES/RESULTS: No results found.   LOS: 2 days   Oren Binet, MD  [facility]  If 7PM-7AM, please contact night-coverage  Please page via [email or web address]  Go to [email or web address] and use [place]'s universal password to access. If you do not have the password, please contact the hospital operator.  Locate the [facility] provider you are looking for under [facility] and page to a number that you can be directly reached.  If you still have difficulty reaching the provider, please page the [facility] (Director on Call) for the Hospitalists listed on amion for assistance.  03/24/2018, 2:18 PM

## 2018-03-24 NOTE — Interval H&P Note (Signed)
History and Physical Interval Note:  62/male, Jehovah's witness, ESRD on HD, history of lupus, CAD, plavix last dose 03/21/2018, with anemia, unremarkable EGD, last colonoscopy 10 years ago, dark stools. 03/24/2018 7:21 AM  Stanley Stewart  has presented today for colonoscopy, with the diagnosis of Anemia.  The various methods of treatment have been discussed with the patient and family. After consideration of risks, benefits and other options for treatment, the patient has consented to  Procedure(s): COLONOSCOPY WITH PROPOFOL (N/A) as a surgical intervention.  The patient's history has been reviewed, patient examined, no change in status, stable for surgery.  I have reviewed the patient's chart and labs.  Questions were answered to the patient's satisfaction.     Ronnette Juniper

## 2018-03-25 DIAGNOSIS — K2971 Gastritis, unspecified, with bleeding: Secondary | ICD-10-CM

## 2018-03-25 LAB — CBC
HCT: 19.1 % — ABNORMAL LOW (ref 39.0–52.0)
Hemoglobin: 5.8 g/dL — CL (ref 13.0–17.0)
MCH: 23.9 pg — ABNORMAL LOW (ref 26.0–34.0)
MCHC: 30.4 g/dL (ref 30.0–36.0)
MCV: 78.6 fL — ABNORMAL LOW (ref 80.0–100.0)
Platelets: 95 10*3/uL — ABNORMAL LOW (ref 150–400)
RBC: 2.43 MIL/uL — AB (ref 4.22–5.81)
RDW: 17.1 % — ABNORMAL HIGH (ref 11.5–15.5)
WBC: 5.1 10*3/uL (ref 4.0–10.5)
nRBC: 0 % (ref 0.0–0.2)

## 2018-03-25 LAB — RENAL FUNCTION PANEL
ANION GAP: 10 (ref 5–15)
Albumin: 2.5 g/dL — ABNORMAL LOW (ref 3.5–5.0)
BUN: 16 mg/dL (ref 6–20)
CO2: 26 mmol/L (ref 22–32)
Calcium: 8 mg/dL — ABNORMAL LOW (ref 8.9–10.3)
Chloride: 100 mmol/L (ref 98–111)
Creatinine, Ser: 5 mg/dL — ABNORMAL HIGH (ref 0.61–1.24)
GFR calc Af Amer: 13 mL/min — ABNORMAL LOW (ref >60)
GFR calc non Af Amer: 12 mL/min — ABNORMAL LOW (ref >60)
Glucose, Bld: 79 mg/dL (ref 70–99)
POTASSIUM: 4.2 mmol/L (ref 3.5–5.1)
Phosphorus: 3.6 mg/dL (ref 2.5–4.6)
Sodium: 136 mmol/L (ref 135–145)

## 2018-03-25 MED ORDER — PANTOPRAZOLE SODIUM 40 MG PO TBEC: 40.0000 mg | DELAYED_RELEASE_TABLET | Freq: Every day | ORAL | 0 refills | Status: AC

## 2018-03-25 NOTE — Progress Notes (Signed)
Capsule endoscopy report  After obtaining informed consent the patient is advised to swallow PillCam. It was noted in the stomach in less than a minute, in the duodenum at 18 minutes and exited into the cecum at 4 hours and 36 minutes.   A small nonbleeding AVM was noted at 1 hour and 48 minutes into the study, likely in the mid jejunum. The mucosa of rest of the small bowel appeared unremarkable. There was no evidence of recent active bleeding noted.  Recommendations: Iron supplementation. Avoid NSAIDs and antiplatelets if possible.  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  Ronnette Juniper, MD

## 2018-03-25 NOTE — Discharge Summary (Signed)
PATIENT DETAILS Name: Stanley Stewart Age: 63 y.o. Sex: male Date of Birth: 12-Aug-1955 MRN: [id number]. Admitting Physician: Albertine Patricia, MD HEN:IDPOEU, Denton Ar, MD  Admit Date: 03/22/2018 Discharge date: 03/25/2018  Recommendations for Outpatient Follow-up:  1. Follow up with PCP in 1-2 weeks 2. Please obtain BMP/CBC in one week 3. Please ensure follow up with gastroenterology  Admitted From:  Home  Disposition: [place]: No  Equipment/Devices: None  Discharge Condition: Stable  CODE STATUS: FULL CODE  Diet recommendation:  Heart Healthy   Brief Summary: See H&P, Labs, Consult and Test reports for all details in brief,Patient is a 63 y.o. male with history of ESRD on HD, CAD on Plavix evaluation of upper GI bleeding and acute blood loss anemia.  Patient is a [employer] will refuse all blood products even in a life-threatening situation.  See below for further details  Brief Hospital Course: Upper GI bleeding with acute blood loss anemia: Thankfully no melena overnight-EGD on 3/20 showed gastropathy treated with APC-but no other obvious foci of bleeding.  Underwent colonoscopy on 3/21 diverticulosis and some friable mucosa with contact bleeding-but no obvious bleeding source as well.    Underwent capsule endoscopy on 3/21-1 nonbleeding AVM.  Hemoglobin low but stable at 5.8.  Patient will get IV iron and Aranesp with hemodialysis.  Since not actively bleeding-and hemoglobin relatively stable-okay to discharge home with close follow-up in the outpatient setting.  Continue to hold Plavix at this point.    ESRD on HD MWF: Continue outpatient HD.  Was seen by nephrology during this hospital stay.  CAD: No anginal symptoms-due to severity of anemia-continue to hold Plavix given severity of anemia.  Last balloon angioplasty was approximately 4 years back.  HTN: Blood pressure currently controlled-resume antihypertensives when able  SLE:  Continue Plaquenil and steroids.  Jehovah's Witness:will refuse all blood products even in a life-threatening situation  Procedures/Studies: See above  Discharge Diagnoses:  Active Problems:   End stage renal disease (HCC)   CAD (coronary artery disease)   Lupus (HCC)   Anemia   Hypertension   Discharge Instructions:  Activity:  As tolerated   Discharge Instructions    Diet - low sodium heart healthy   Complete by:  As directed    Discharge instructions   Complete by:  As directed    Hold Plavix for now  Follow with Dr. Paulita Fujita in 1 week  Follow with Primary MD  [name], MD  and other consultant's as instructed your Hospitalist MD  Please get a complete blood count and chemistry panel checked by your Primary MD at your next visit, and again as instructed by your Primary MD.  Get Medicines reviewed and adjusted: Please take all your medications with you for your next visit with your Primary MD  Laboratory/radiological data: Please request your Primary MD to go over all hospital tests and procedure/radiological results at the follow up, please ask your Primary MD to get all Hospital records sent to his/her office.  In some cases, they will be blood work, cultures and biopsy results pending at the time of your discharge. Please request that your primary care M.D. follows up on these results.  Also Note the following: If you experience worsening of your admission symptoms, develop shortness of breath, life threatening emergency, suicidal or homicidal thoughts you must seek medical attention immediately by calling 911 or calling your MD immediately  if symptoms less severe.  You must read complete instructions/literature along  with all the possible adverse reactions/side effects for all the Medicines you take and that have been prescribed to you. Take any new Medicines after you have completely understood and accpet all the possible adverse reactions/side effects.    Do not drive when taking Pain medications or sleeping medications (Benzodaizepines)  Do not take more than prescribed Pain, Sleep and Anxiety Medications. It is not advisable to combine anxiety,sleep and pain medications without talking with your primary care practitioner  Special Instructions: If you have smoked or chewed Tobacco  in the last 2 yrs please stop smoking, stop any regular Alcohol  and or any Recreational drug use.  Wear Seat belts while driving.  Please note: You were cared for by a hospitalist during your hospital stay. Once you are discharged, your primary care physician will handle any further medical issues. Please note that NO REFILLS for any discharge medications will be authorized once you are discharged, as it is imperative that you return to your primary care physician (or establish a relationship with a primary care physician if you do not have one) for your post hospital discharge needs so that they can reassess your need for medications and monitor your lab values.   Increase activity slowly   Complete by:  As directed      Allergies as of 03/25/2018      Reactions   Adhesive [tape]    Pulls skin off,  ONLY use paper tape.    Penicillins Other (See Comments)   Patient states that MD told him he was allergic to this med but he does not recall having any reactions    Amitriptyline Rash   Fluconazole Rash      Medication List    STOP taking these medications   clopidogrel 75 MG tablet Commonly known as:  PLAVIX     TAKE these medications   acetaminophen 500 MG tablet Commonly known as:  TYLENOL Take 500 mg by mouth every 6 (six) hours as needed for mild pain or headache.   amLODipine 5 MG tablet Commonly known as:  NORVASC TAKE 1 TABLET BY MOUTH ONCE DAILY   atorvastatin 40 MG tablet Commonly known as:  LIPITOR Take 1 tablet (40 mg total) by mouth daily at 6 PM.   epoetin alfa 4000 UNIT/ML injection Commonly known as:  EPOGEN,PROCRIT Inject  4,000 Units into the vein every 30 (thirty) days. At Dialysis   Etelcalcetide HCl 2.5 MG/0.5ML Soln Inject 2.5 mg into the vein 3 (three) times a week.   Fosrenol 750 MG chewable tablet Generic drug:  lanthanum Chew 750 mg by mouth 3 (three) times daily with meals.   gabapentin 100 MG capsule Commonly known as:  NEURONTIN Take 100-200 mg by mouth at bedtime.   hydroxychloroquine 200 MG tablet Commonly known as:  PLAQUENIL Take 200 mg by mouth daily.   pantoprazole 40 MG tablet Commonly known as:  Protonix Take 1 tablet (40 mg total) by mouth daily for 30 days.   predniSONE 5 MG tablet Commonly known as:  DELTASONE Take 5 mg by mouth daily.   Rena-Vite Rx 1 MG Tabs Take 1 tablet by mouth 3 (three) times a week. Mon, Wed, Fri.      Follow-up Information    [name], MD. Schedule an appointment as soon as possible for a visit in 1 week(s).   Specialty:  Internal Medicine Contact information: [address] [place] [address] [phone]        Lauree Chandler  D, MD. Schedule an appointment as soon as possible for a visit in 2 day(s).   Specialty:  Cardiology Contact information: [address] [place] [address] [phone]        Arta Silence, MD. Schedule an appointment as soon as possible for a visit in 1 week(s).   Specialty:  Gastroenterology Contact information: [address] [place] [address] [phone]          Allergies  Allergen Reactions   Adhesive [Tape]     Pulls skin off,  ONLY use paper tape.    Penicillins Other (See Comments)    Patient states that MD told him he was allergic to this med but he does not recall having any reactions    Amitriptyline Rash   Fluconazole Rash    Consultations:   GI and nephrology   Other Procedures/Studies:  No results found.   TODAY-DAY OF DISCHARGE:  Subjective:   Ashely Joshua today has no headache,no  chest abdominal pain,no new weakness tingling or numbness, feels much better wants to go home today.   Objective:   Blood pressure (!) 127/57, pulse 78, temperature 98.4 F (36.9 C), temperature source Oral, resp. rate 16, height 5\' 9"  (1.753 m), weight 62.2 kg, SpO2 100 %.  Intake/Output Summary (Last 24 hours) at 03/25/2018 1001 Last data filed at 03/24/2018 1600 Gross per 24 hour  Intake 64.4 ml  Output --  Net 64.4 ml   Filed Weights   03/23/18 1546 03/23/18 1901 03/25/18 0548  Weight: 58 kg 58 kg 62.2 kg    Exam: Awake Alert, Oriented *3, No new F.N deficits, Normal affect [place].AT,PERRAL Supple Neck,No JVD, No cervical lymphadenopathy appriciated.  Symmetrical Chest wall movement, Good air movement bilaterally, CTAB RRR,No Gallops,Rubs or new Murmurs, No Parasternal Heave +ve B.Sounds, Abd Soft, Non tender, No organomegaly appriciated, No rebound -guarding or rigidity. No Cyanosis, Clubbing or edema, No new Rash or bruise   PERTINENT RADIOLOGIC STUDIES: No results found.   PERTINENT LAB RESULTS: CBC: Recent Labs    03/24/18 0421 03/25/18 0425  WBC 4.5 5.1  HGB 5.7* 5.8*  HCT 18.5* 19.1*  PLT 103* 95*   CMET CMP     Component Value Date/Time   NA 136 03/24/2018 0421   K 4.2 03/24/2018 0421   CL 100 03/24/2018 0421   CO2 26 03/24/2018 0421   GLUCOSE 79 03/24/2018 0421   BUN 16 03/24/2018 0421   CREATININE 5.00 (H) 03/24/2018 0421   CALCIUM 8.0 (L) 03/24/2018 0421   CALCIUM 9.3 11/06/2006 1519   PROT 6.1 (L) 03/22/2018 1640   ALBUMIN 2.5 (L) 03/24/2018 0421   AST 22 03/22/2018 1640   ALT 22 03/22/2018 1640   ALKPHOS 53 03/22/2018 1640   BILITOT 0.6 03/22/2018 1640   GFRNONAA 12 (L) 03/24/2018 0421   GFRAA 13 (L) 03/24/2018 0421    GFR Estimated Creatinine Clearance: 13.8 mL/min (A) (by C-G formula based on SCr of 5 mg/dL (H)). No results for input(s): LIPASE, AMYLASE in the last 72 hours. No results for input(s): CKTOTAL, CKMB, CKMBINDEX, TROPONINI  in the last 72 hours. Invalid input(s): POCBNP No results for input(s): DDIMER in the last 72 hours. No results for input(s): HGBA1C in the last 72 hours. No results for input(s): CHOL, HDL, LDLCALC, TRIG, CHOLHDL, LDLDIRECT in the last 72 hours. No results for input(s): TSH, T4TOTAL, T3FREE, THYROIDAB in the last 72 hours.  Invalid input(s): FREET3 Recent Labs    03/22/18  1640  VITAMINB12 592  FOLATE 26.2  FERRITIN 1,223*  TIBC 200*  IRON 42*  RETICCTPCT 1.8   Coags: Recent Labs    03/22/18 1640  INR 1.1   Microbiology: Recent Results (from the past 240 hour(s))  MRSA PCR Screening     Status: None   Collection Time: 03/22/18  4:35 PM  Result Value Ref Range Status   MRSA by PCR NEGATIVE NEGATIVE Final    Comment:        The GeneXpert MRSA Assay (FDA approved for NASAL specimens only), is one component of a comprehensive MRSA colonization surveillance program. It is not intended to diagnose MRSA infection nor to guide or monitor treatment for MRSA infections. Performed at [facility], [place] [address] [place], [place] [address]     FURTHER DISCHARGE INSTRUCTIONS:  Get Medicines reviewed and adjusted: Please take all your medications with you for your next visit with your Primary MD  Laboratory/radiological data: Please request your Primary MD to go over all hospital tests and procedure/radiological results at the follow up, please ask your Primary MD to get all Hospital records sent to his/her office.  In some cases, they will be blood work, cultures and biopsy results pending at the time of your discharge. Please request that your primary care M.D. goes through all the records of your hospital data and follows up on these results.  Also Note the following: If you experience worsening of your admission symptoms, develop shortness of breath, life threatening emergency, suicidal or homicidal thoughts you must seek medical attention immediately by calling  911 or calling your MD immediately  if symptoms less severe.  You must read complete instructions/literature along with all the possible adverse reactions/side effects for all the Medicines you take and that have been prescribed to you. Take any new Medicines after you have completely understood and accpet all the possible adverse reactions/side effects.   Do not drive when taking Pain medications or sleeping medications (Benzodaizepines)  Do not take more than prescribed Pain, Sleep and Anxiety Medications. It is not advisable to combine anxiety,sleep and pain medications without talking with your primary care practitioner  Special Instructions: If you have smoked or chewed Tobacco  in the last 2 yrs please stop smoking, stop any regular Alcohol  and or any Recreational drug use.  Wear Seat belts while driving.  Please note: You were cared for by a hospitalist during your hospital stay. Once you are discharged, your primary care physician will handle any further medical issues. Please note that NO REFILLS for any discharge medications will be authorized once you are discharged, as it is imperative that you return to your primary care physician (or establish a relationship with a primary care physician if you do not have one) for your post hospital discharge needs so that they can reassess your need for medications and monitor your lab values.  Total Time spent coordinating discharge including counseling, education and face to face time equals 35 minutes.  SignedOren Binet 03/25/2018 10:01 AM

## 2018-03-25 NOTE — Progress Notes (Addendum)
[facility] Progress Note   Subjective: No C/Os. Denies chest pain, SOB, dizziness. Very pleasant.   Objective Vitals:   03/24/18 2130 03/25/18 0526 03/25/18 0548 03/25/18 0853  BP: (!) 115/50 (!) 104/48  (!) 127/57  Pulse: 82 78    Resp: 16 16    Temp: 98.9 F (37.2 C) 98.4 F (36.9 C)    TempSrc: Oral Oral    SpO2: 97% 100%    Weight:   62.2 kg   Height:       Physical Exam General:Chronically ill appearing male in NAD Heart:S1,S2, RRR Lungs: CTAB Abdomen: S, NT Extremities: no LE edema.  Dialysis Access: L AVF aneurysmal, + bruit.   Additional Objective Labs: Basic Metabolic Panel: Recent Labs  Lab 03/22/18 1640 03/24/18 0421  NA 139 136  K 5.3* 4.2  CL 96* 100  CO2 31 26  GLUCOSE 93 79  BUN 44* 16  CREATININE 7.78* 5.00*  CALCIUM 9.0 8.0*  PHOS  --  3.6   Liver Function Tests: Recent Labs  Lab 03/22/18 1640 03/24/18 0421  AST 22  --   ALT 22  --   ALKPHOS 53  --   BILITOT 0.6  --   PROT 6.1*  --   ALBUMIN 3.0* 2.5*   No results for input(s): LIPASE, AMYLASE in the last 168 hours. CBC: Recent Labs  Lab 03/22/18 1640 03/23/18 0800 03/24/18 0421 03/25/18 0425  WBC 5.0 4.4 4.5 5.1  HGB 6.8* 5.9* 5.7* 5.8*  HCT 22.5* 19.2* 18.5* 19.1*  MCV 78.9* 78.4* 79.1* 78.6*  PLT 127* 108* 103* 95*   Blood Culture No results found for: SDES, SPECREQUEST, CULT, REPTSTATUS  Cardiac Enzymes: No results for input(s): CKTOTAL, CKMB, CKMBINDEX, TROPONINI in the last 168 hours. CBG: No results for input(s): GLUCAP in the last 168 hours. Iron Studies:  Recent Labs    03/22/18 1640  IRON 42*  TIBC 200*  FERRITIN 1,223*   @lablastinr3 @ Studies/Results: No results found. Medications: . [START ON 03/26/2018] ferric gluconate (FERRLECIT/NULECIT) IV     . amLODipine  5 mg Oral Daily  . atorvastatin  40 mg Oral q1800  . epoetin (EPOGEN/PROCRIT) injection  20,000 Units Subcutaneous Q M,W,F-HD  . gabapentin  100-200 mg Oral QHS  .  hydroxychloroquine  200 mg Oral Daily  . lanthanum  750 mg Oral TID WC  . [START ON 03/26/2018] multivitamin  1 tablet Oral Once per day on Mon Wed Fri  . pantoprazole (PROTONIX) IV  40 mg Intravenous Q12H  . predniSONE  5 mg Oral Q lunch     Dialysis:[facility] MWF 3 hr 15 min 180 NRe 400/Autoflow 1.5 57.5 kg 2.0 K/2.0 Ca UFP 3  L AVF -Heparin 1000 units IV TIW -Hectorol 6 mcg IV TIW -Parsabiv 10 mg IV TIW -Mircera 200 mcg IV q 2 weeks (last dose Mircera 30 mcg IV 03/12/18 Last HGB 7.1 03/21/18)   Assessment/Plan: 1. Anemia of ckd +ABLA, suspected GOB - HGB 5.7. Patient is Jehovah Witness-does not accept blood products. Epogen high-dose 20K w/ HD tiw started here with HD and we started IV Fe load for Tsat 21%. 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 Colonoscopy showed melanosis in colon, friable mucosa ascending colol and cecum, diverticulosis sigmoid colon. Capsule endo showed one small non-bleeding AVM in the jejunum.  Patient being dc'd to home.  Will cont high dose ESA and IV Fe w OP HD. No heparin at OP HD for now due to suspected GIB. 2. ESRD - MWF.  No heparin. 3. Hypertension/volume - BP controlled, no evidence of volume overload by exam. HD 03/24/18 Pre  wt 58kg Net UF 300cc Post wt 58 kg. No evidence of volume overload.   4. Anemia - As noted above.Patient is asymptomatic. 5. Metabolic bone disease - Ca 9.0 C Ca 9.8. Hold Velphoro binders-use Forenol while in hospital. Hermina Staggers not available on hospital formulary. Continue VDRA, 6. Nutrition - Cl liquid diet. Albumin 3.0 Renal diet, prostat renal vit when eating.  7. SLE-per primary 8. CAD-seen by cards for clearance to hold plavix. No chest pain. 9. Dispo - for dc home today.  HD tomorrow at OP center, no heparin due to suspected GIB.   Rita H. Brown NP-C 03/25/2018, 9:25 AM  [facility] [phone]  Pt seen, examined and agree w assess/plan as above with additions as indicated.  [facility] 03/25/2018, 1:37 PM

## 2018-03-25 NOTE — Progress Notes (Signed)
Stanley Stewart to be D/C'd Home per MD order.  Discussed with the patient and all questions fully answered.  VSS, Skin clean, dry and intact without evidence of skin break down, no evidence of skin tears noted. IV catheter discontinued intact. Site without signs and symptoms of complications. Dressing and pressure applied.  An After Visit Summary was printed and given to the patient. Patient received prescription.  D/c education completed with patient/family including follow up instructions, medication list, d/c activities limitations if indicated, with other d/c instructions as indicated by MD - patient able to verbalize understanding, all questions fully answered.   Patient instructed to return to ED, call 911, or call MD for any changes in condition.   Patient escorted via [place], and D/C home via private auto.  Luci Bank 03/25/2018 3:14 PM

## 2018-03-25 NOTE — Progress Notes (Signed)
Upper GI-1 small nonbleeding AVM seen on capsule endoscopy.  Okay to discharge-patient will need to follow-up with primary gastroenterologist-Dr. Paulita Fujita.

## 2018-04-10 ENCOUNTER — Other Ambulatory Visit: Payer: Self-pay | Admitting: Cardiology

## 2018-04-10 NOTE — Telephone Encounter (Signed)
Plavix was recently discontinued due to recent GIB and severe anemia. Will not refill at this time, unless cleared by GI to resume. After chart review and based on medical history, I believe risk > benefit. He should keep his f/u with Dr. [name] next month.

## 2018-04-11 ENCOUNTER — Telehealth: Payer: Self-pay

## 2018-04-11 NOTE — Telephone Encounter (Signed)
I called patient to inform him that recently his Plavix was discontinued and we would not be filling due to GIB and anemia per Lyda Jester.  I also reminded him of his upcoming appt with Dr [name] in May to further discuss.  He verbalized understanding and was thankful for the call.

## 2018-05-03 ENCOUNTER — Telehealth: Payer: Self-pay

## 2018-05-03 NOTE — Telephone Encounter (Signed)
   TELEPHONE CALL NOTE  This patient has been deemed a candidate for follow-up tele-health visit to limit community exposure during the Covid-19 pandemic. I spoke with the patient via phone to discuss instructions.    A Virtual Office Visit appointment type has been scheduled for 05/08/18 with Lyda Jester, PA, with "VIDEO" or "TELEPHONE" in the appointment notes - patient prefers TELEPHONE type.   Frederik Schmidt, RN 05/03/2018 2:58 PM   Patient has verbally consented to a Virtual Visit by Phone.

## 2018-05-08 ENCOUNTER — Telehealth: Payer: Self-pay | Admitting: Cardiology

## 2018-05-08 ENCOUNTER — Other Ambulatory Visit: Payer: Self-pay

## 2018-05-08 ENCOUNTER — Encounter: Payer: Self-pay | Admitting: Cardiology

## 2018-05-08 ENCOUNTER — Telehealth (INDEPENDENT_AMBULATORY_CARE_PROVIDER_SITE_OTHER): Payer: Medicare Other | Admitting: Cardiology

## 2018-05-08 VITALS — BP 120/65 | Ht 69.0 in | Wt 127.0 lb

## 2018-05-08 DIAGNOSIS — I2510 Atherosclerotic heart disease of native coronary artery without angina pectoris: Secondary | ICD-10-CM | POA: Diagnosis not present

## 2018-05-08 NOTE — Patient Instructions (Signed)
Medication Instructions:  none If you need a refill on your cardiac medications before your next appointment, please call your pharmacy.   Lab work: none If you have labs (blood work) drawn today and your tests are completely normal, you will receive your results only by: [name] MyChart Message (if you have MyChart) OR . A paper copy in the mail If you have any lab test that is abnormal or we need to change your treatment, we will call you to review the results.  Testing/Procedures: none  Follow-Up:  6 Month with Dr [name] At [facility], you and your health needs are our priority.  As part of our continuing mission to provide you with exceptional heart care, we have created designated Provider Care Teams.  These Care Teams include your primary Cardiologist (physician) and Advanced Practice Providers (APPs -  Physician Assistants and Nurse Practitioners) who all work together to provide you with the care you need, when you need it. Any Other Special Instructions Will Be Listed Below (If Applicable).

## 2018-05-08 NOTE — Telephone Encounter (Signed)
New Message           Patient is calling in today to  Change the way his appointment should go today. Patient wants [place] to contact him thru his email ([email or web address]) not sure if this can be done or not. Pls  Call to advise.

## 2018-05-08 NOTE — Progress Notes (Signed)
Virtual Visit via Telephone Note   This visit type was conducted due to national recommendations for restrictions regarding the COVID-19 Pandemic (e.g. social distancing) in an effort to limit this patient's exposure and mitigate transmission in our community.  Due to his co-morbid illnesses, this patient is at least at moderate risk for complications without adequate follow up.  This format is felt to be most appropriate for this patient at this time.  The patient did not have access to video technology/had technical difficulties with video requiring transitioning to audio format only (telephone).  All issues noted in this document were discussed and addressed.  No physical exam could be performed with this format.  Please refer to the patient's chart for his  consent to telehealth for [facility].   Date:  05/08/2018   ID:  Stanley Stewart, DOB [birth year], MRN [id number]  Patient Location: Home Provider Location: Home  PCP:  [name], MD  Cardiologist:  Lauree Chandler, MD  Electrophysiologist:  None   Evaluation Performed:  Follow-Up Visit  Chief Complaint:  F/u for CAD and recent hospitalization for GIB  History of Present Illness:    Stanley Stewart is a 63 y.o. male who is being seen today for routine f/u given h/o CAD. He is followed by Dr. [name]. He has a h/o PTCA tot the RPDA in 2014. EF was normal at 55%. 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 He was seen 10/2015. He noted some atypical CP (resting pain, non exertional) and was ordered to get a NST. The study showed a medium defect of severe severity present in the mid inferior, apical inferior and apex location. Findingswereconsistent with prior myocardial infarction with peri-infarct ischemia. Results were interpreted as low risk. EF normal at 55-65%. He also had a 2D echo which confirmed normal LVF at 50-55% and normal wall motion as well as mild AS/AI. His stress test was reviewed by Dr. [name]  and he was advised to f/u to discuss if cath would be needed. Pt was seen back on 11/25/15 and had noted resolution of symptoms. He denied any exertional CP. He had returned to yard work (raking leaves) and noted no difficulty. We elected to continue medical therapy. He was continued on ASA and Plavix. Also on statin and lipids followed by PCP. Pt is also a [employer] and refuses blood transfusion.   Unfortunately, he was recently admitted to the hospital for an acute GIB and anemia. Hgb dropped to 5.8. He refused blood transfusion. He was placed on IV iron and Aranesp. Plavix discontinued. EGD on 3/20 showed gastropathy treated with APC-but no other obvious foci of bleeding. Underwent colonoscopy on 3/21 diverticulosis and some friable mucosa with contact bleeding-but no obvious bleeding source as well. Underwent capsule endoscopy on 3/21-1 nonbleeding AVM.   Today in follow-up, the patient reports that he has done significantly well since his hospitalization.  He has a good report that his hemoglobin is now up to 10.5.  His energy levels have improved.  He remains off of Plavix.  Also not on aspirin.  Aspirin was discontinued many years ago.  He denies any recurrent melena or hematochezia.  Since being off antiplatelet therapy he also denies any anginal symptomatology.  No chest pain or dyspnea.  He does do some light yard work without any exertional symptoms.  He is on hemodialysis Monday Wednesday Friday.  He also reports that he is hoping to be considered for renal transplant.  The patient does not have symptoms concerning for COVID-19 infection (fever, chills, cough, or new shortness of breath).    Past Medical History:  Diagnosis Date  . Anemia   . Arthritis   . Coronary artery disease    a. 12/2012 Cath/PTCA: LM nl, LAD 87m, LCX nl, RCA 30d, RPDA 99 (PTCA only - 1.5-42mm vessel), EF 55%, subtle apical HK.  [name] ESRD (end stage renal disease) ([place])    Patient started dialysis around  2010.  Pt states the cause of ESRD was lupus and/or HBP.  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 Gets hemodialysis at [facility]) on MWF schedule using L arm AVF.    [name] Herpes zoster   . Hypertension   . Iritis   . Lupus ([place]) 2002  . 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 Peripheral neuropathy   . Pseudoaneurysm ([place])    Enlarged Left   FA    AVF  . Vertigo    Past Surgical History:  Procedure Laterality Date  . AV FISTULA PLACEMENT  05/28/2006   LEFT forearm AVF  . BIOPSY  03/23/2018   Procedure: BIOPSY;  Surgeon: Otis Brace, MD;  Location: [place] [facility];  Service: Gastroenterology;;  . CARDIAC CATHETERIZATION  12/20/2012  . COLONOSCOPY    . COLONOSCOPY WITH PROPOFOL N/A 03/24/2018   Procedure: COLONOSCOPY WITH PROPOFOL;  Surgeon: Ronnette Juniper, MD;  Location: [place];  Service: Gastroenterology;  Laterality: N/A;  . CORONARY ANGIOPLASTY  12/20/2012  . ESOPHAGOGASTRODUODENOSCOPY (EGD) WITH PROPOFOL N/A 03/23/2018   Procedure: ESOPHAGOGASTRODUODENOSCOPY (EGD) WITH PROPOFOL;  Surgeon: Otis Brace, MD;  Location: [place];  Service: Gastroenterology;  Laterality: N/A;  . FISTULA SUPERFICIALIZATION Left 3/55/9741   Procedure: PLICATION OF RADIOCEPHALIC ARTERIOVENOUS FISTULA;  Surgeon: Conrad Sugar Grove, MD;  Location: [place];  Service: Vascular;  Laterality: Left;  . FRACTURE SURGERY  Oct. 2009   Right Hip  . GIVENS CAPSULE STUDY N/A 03/24/2018   Procedure: GIVENS CAPSULE STUDY;  Surgeon: Ronnette Juniper, MD;  Location: [place];  Service: Gastroenterology;  Laterality: N/A;  . HOT HEMOSTASIS N/A 03/23/2018   Procedure: HOT HEMOSTASIS (ARGON PLASMA COAGULATION/BICAP);  Surgeon: Otis Brace, MD;  Location[facility] END[facility]COPY;  Service: Gastroenterology;  Laterality: N/A;  . LEFT HEART CATHETERIZATION WITH CORONARY ANGIOGRAM N/A 12/20/2012   Procedure: LEFT HEART CATHETERIZATION WITH CORONARY ANGIOGRAM;   Surgeon: Burnell Blanks, MD;  Location: [facility] CATH LAB;  Service: Cardiovascular;  Laterality: N/A;  . lymph node biopsy right arm    . VASECTOMY       Current Meds  Medication Sig  . acetaminophen (TYLENOL) 500 MG tablet Take 500 mg by mouth every 6 (six) hours as needed for mild pain or headache.  [name] amLODipine (NORVASC) 5 MG tablet TAKE 1 TABLET BY MOUTH ONCE DAILY  . atorvastatin (LIPITOR) 40 MG tablet Take 1 tablet (40 mg total) by mouth daily at 6 PM.  . B Complex-C-Folic Acid (RENA-VITE RX) 1 MG TABS Take 1 tablet by mouth 3 (three) times a week. Mon, Wed, Fri.  [name] epoetin alfa (EPOGEN,PROCRIT) 4000 UNIT/ML injection Inject 4,000 Units into the vein every 30 (thirty) days. At Dialysis  . Etelcalcetide HCl 2.5 MG/0.5ML SOLN Inject 2.5 mg into the vein 3 (three) times a week.  [name] FOSRENOL 750 MG chewable tablet Chew 750 mg by mouth 3 (three) times daily with meals.   . gabapentin (NEURONTIN) 100 MG capsule Take  100-200 mg by mouth at bedtime.  . hydroxychloroquine (PLAQUENIL) 200 MG tablet Take 200 mg by mouth daily.   . predniSONE (DELTASONE) 5 MG tablet Take 5 mg by mouth daily.      Allergies:   Adhesive [tape]; Penicillins; Amitriptyline; and Fluconazole   Social History   Tobacco Use  . Smoking status: Never Smoker  . Smokeless tobacco: Never Used  Substance Use Topics  . Alcohol use: No  . Drug use: No     Family Hx: The patient's family history includes CAD in his father; Hypertension in his father and mother.  ROS:   Please see the history of present illness.     All other systems reviewed and are negative.   Prior CV studies:   The following studies were reviewed today:  2D Echo 2017  Study Conclusions  - Left ventricle: The cavity size was normal. Wall thickness was   increased in a pattern of moderate LVH. Systolic function was   normal. The estimated ejection fraction was in the range of 50%   to 55%. Wall motion was normal; there were no regional  wall   motion abnormalities. Features are consistent with a pseudonormal   left ventricular filling pattern, with concomitant abnormal   relaxation and increased filling pressure (grade 2 diastolic   dysfunction). - Aortic valve: Moderately calcified annulus. Moderately thickened,   moderately calcified leaflets. There was mild stenosis. There was   mild regurgitation. Valve area (VTI): 2.29 cm^2. Valve area   (Vmax): 1.99 cm^2. Valve area (Vmean): 2.24 cm^2. - Mitral valve: Valve area by continuity equation (using LVOT   flow): 3.01 cm^2. - Left atrium: The atrium was moderately dilated.  Labs/Other Tests and Data Reviewed:    EKG:  An ECG dated 3/20120 was personally reviewed today and demonstrated:  NSR, LVH 81 bpm  Recent Labs: 03/22/2018: ALT 22 03/24/2018: BUN 16; Creatinine, Ser 5.00; Potassium 4.2; Sodium 136 03/25/2018: Hemoglobin 5.8; Platelets 95   Recent Lipid Panel Lab Results  Component Value Date/Time   CHOL 108 09/11/2014 09:44 AM   TRIG 50.0 09/11/2014 09:44 AM   HDL 49.40 09/11/2014 09:44 AM   CHOLHDL 2 09/11/2014 09:44 AM   LDLCALC 48 09/11/2014 09:44 AM    Wt Readings from Last 3 Encounters:  05/08/18 127 lb (57.6 kg)  03/25/18 137 lb 2 oz (62.2 kg)  01/12/18 126 lb 1.9 oz (57.2 kg)     Objective:    Vital Signs:  BP 120/65   Ht 5\' 9"  (1.753 m)   Wt 127 lb (57.6 kg)   BMI 18.75 kg/m    Physical Exam: Well sounding male in no acute distress.  Speaking in clear complete sentences.  Speech unlabored.  Breathing unlabored.  ASSESSMENT & PLAN:    1. CAD: s/p PTCA of RPDA in 2014 with residual mild nonobstructive disease in the mid LAD and distal RCA. NST 11/2015 showed a medium defect of severe severity present in the mid inferior, apical inferior and apex location. Findingswereconsistent with prior myocardial infarction with peri-infarct ischemia. Results were interpreted as low risk. EF normal at 55-65%.  He continues to do well.  No anginal symptoms.  Denies CP and dyspnea. No exertional symptoms. Continue medical therapy. No longer on antiplatelets due recent GI bleed and risk of recurrent bleed and refusal to accept blood transfusion given his religious believes.  2. Aortic Stenosis: echo 11/2015 showed mild AS w/ mild AI. He denies CP, dyspnea, syncope/ near syncope.   3.  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 Followed by [facility]. Hoping to be considered for transplant.   4. HTN: controlled on current regimen. 120/65. No changes made today.   5. HLD: on statin therapy w/ Lipitor. Lipids followed by PCP, Dr. Deforest Hoyles.   6. Recent GIB w/ ABLA: Hgb dropped to 5.8. He refused blood transfusion due to being a [employer]. He was placed on IV iron and Aranesp. Plavix discontinued. EGD on 3/20 showed gastropathy treated with APC-but no other obvious foci of bleeding. Underwent colonoscopy on 3/21 diverticulosis and some friable mucosa with contact bleeding-but no obvious bleeding source as well. Underwent capsule endoscopy on 3/21-1 nonbleeding AVM. Labs followed by PCP and nephrology. He reports hgb now up to 10.5. No recurrent melena. Has followed up with gastroenterology. No further Plavix use.    COVID-19 Education: The signs and symptoms of COVID-19 were discussed with the patient and how to seek care for testing (follow up with PCP or arrange E-visit).  The importance of social distancing was discussed today.  Time:   Today, I have spent 15 minutes with the patient with telehealth technology discussing the above problems.     Medication Adjustments/Labs and Tests Ordered: Current medicines are reviewed at length with the patient today.  Concerns regarding medicines are outlined above.   Tests Ordered: No orders of the defined types were placed in this encounter.   Medication Changes: No orders of the defined types were placed in this encounter.   Disposition:  Follow up in 6 months w/ Dr. [name] or Myself  Signed,  Lyda Jester, PA-C  05/08/2018 2:50 PM    [place]

## 2018-05-08 NOTE — Telephone Encounter (Signed)
I spoke to the patient and confirmed phone virtual visit for 2:30 on 5/5.  He said that his video did not work the other day with PCP.

## 2018-05-10 ENCOUNTER — Ambulatory Visit: Payer: Medicare Other | Admitting: Cardiovascular Disease

## 2018-05-11 ENCOUNTER — Ambulatory Visit: Payer: Medicare Other | Admitting: Cardiovascular Disease

## 2018-06-04 DEATH — deceased

## 2018-07-19 ENCOUNTER — Telehealth: Payer: Self-pay | Admitting: Internal Medicine

## 2018-08-09 ENCOUNTER — Encounter: Payer: Medicare Other | Admitting: Vascular Surgery

## 2020-03-28 IMAGING — MR MR ABDOMEN W/O CM
8 of 10 series · 32 of 48 positions shown · non-contrast
Comparison: 03/31/2015

CLINICAL DATA: Renal cysts.

EXAM:
MRI ABDOMEN WITHOUT CONTRAST
TECHNIQUE: Multiplanar multisequence MR imaging was performed without the
administration of intravenous contrast.

[Series 4: T2 · coronal · 5.0mm · 0.78mm/px · 4 of 45 slices shown (1 of 3)]
[im 1/45]
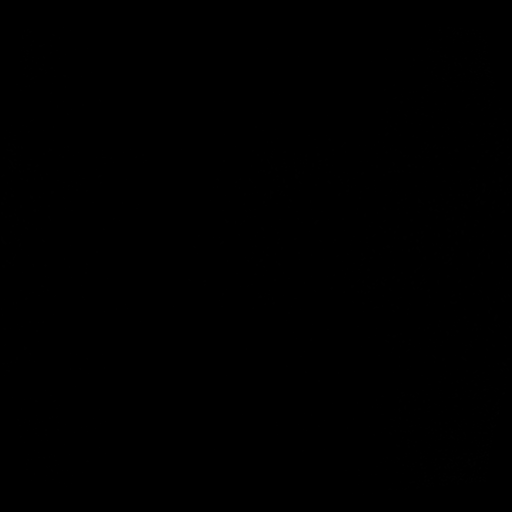
[im 15/45]
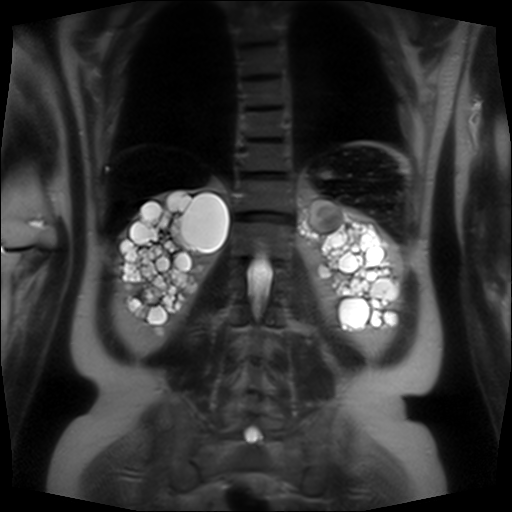
[im 30/45]
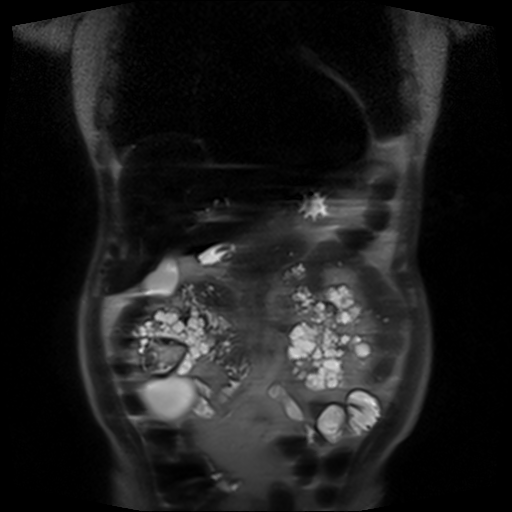
[im 45/45]
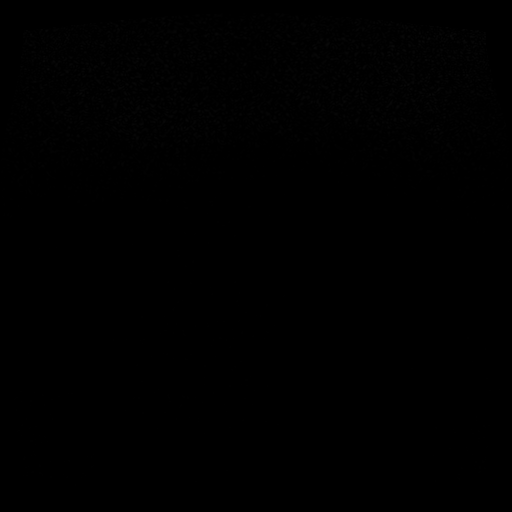

[Series 5: T1 · axial · 5.0mm · 0.66mm/px · z∈[-128,+178]mm · 7 of 104 slices shown]
[im 1/104]
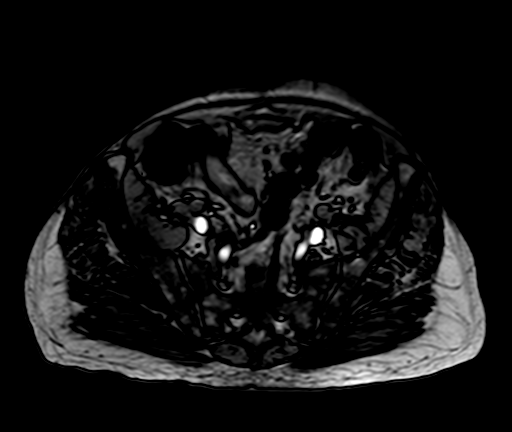
[im 18/104]
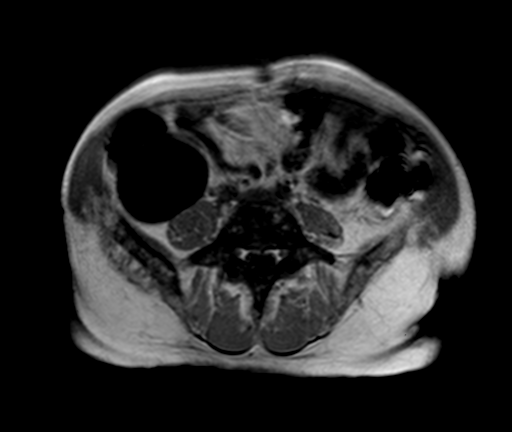
[im 35/104]
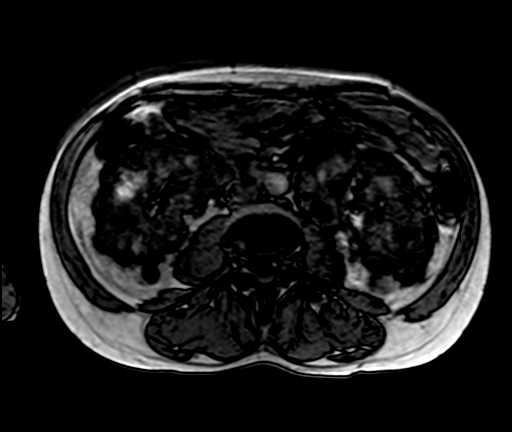
[im 52/104]
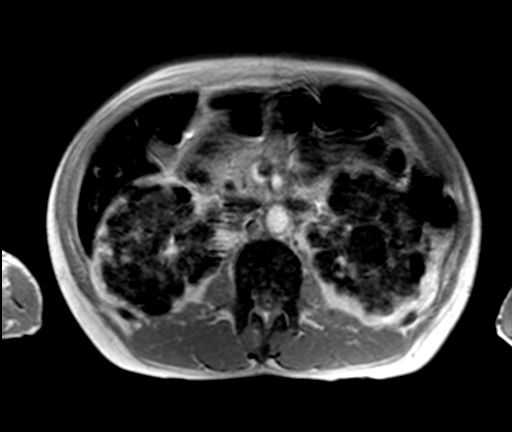
[im 69/104]
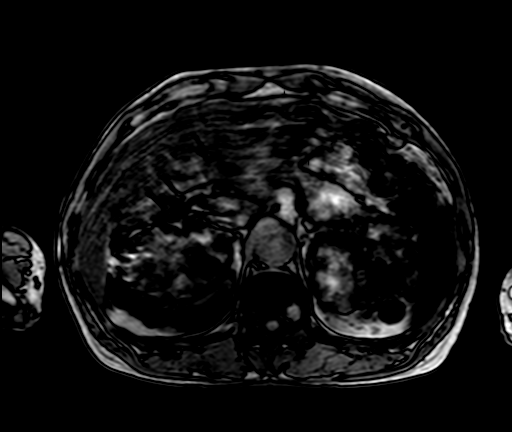
[im 86/104]
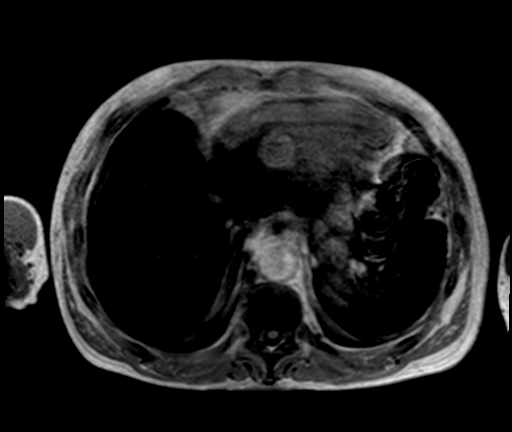
[im 104/104]
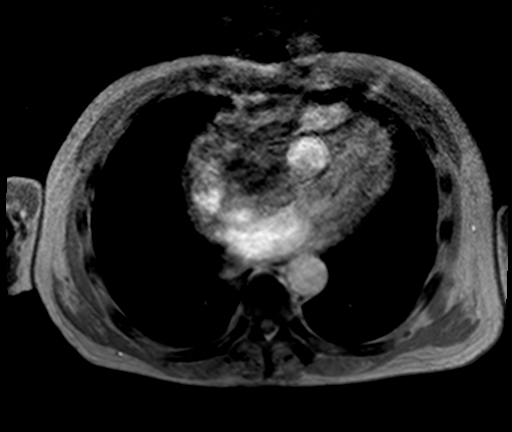

[Series 6: T2 · axial · 5.0mm · 0.66mm/px · z∈[-128,+178]mm · 3 of 52 slices shown (2 of 3)]
[im 1/52]
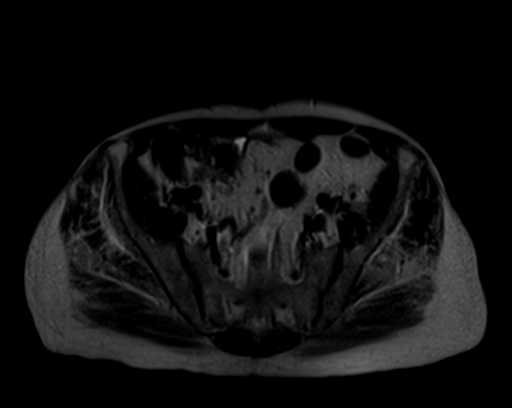
[im 26/52]
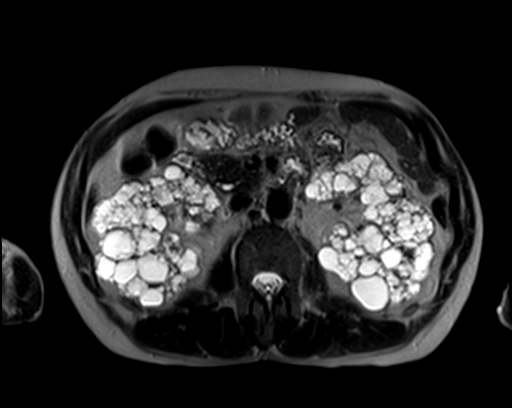
[im 52/52]
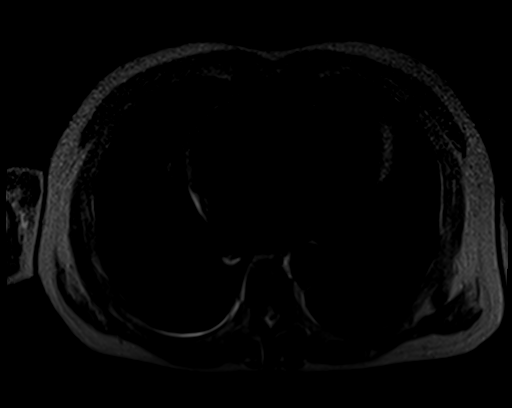

[Series 7: ep2d_diff_b50_500_800_p2_trig · axial · 5.0mm · 1.98mm/px · z∈[-122,+178]mm · 8 of 152 slices shown]
[im 1/152]
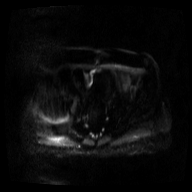
[im 17/152]
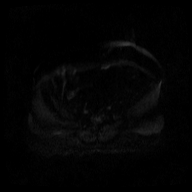
[im 51/152]
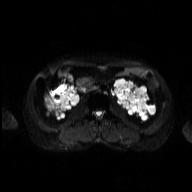
[im 68/152]
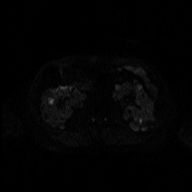
[im 84/152]
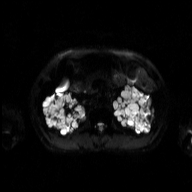
[im 101/152]
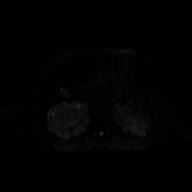
[im 135/152]
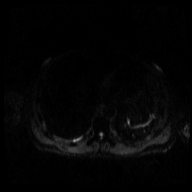
[im 152/152]
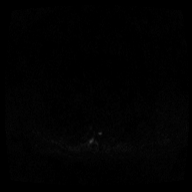

[Series 8: ep2d_diff_b50_500_800_p2_trig_adc · axial · 5.0mm · 1.98mm/px · z∈[-128,+178]mm · 3 of 52 slices shown]
[im 1/52]
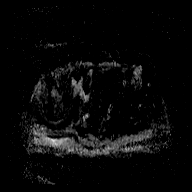
[im 26/52]
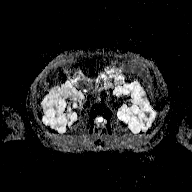
[im 52/52]
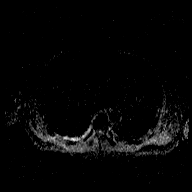

[Series 9: T2 · axial · 5.0mm · 1.33mm/px · z∈[-128,+178]mm · 3 of 52 slices shown (3 of 3)]
[im 1/52]
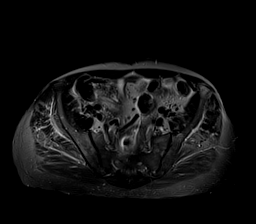
[im 26/52]
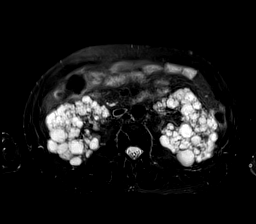
[im 52/52]
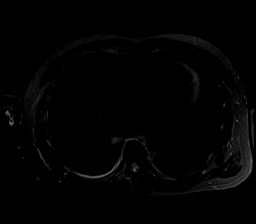

[Series 10: bSSFP · axial · 5.0mm · 0.74mm/px · z∈[-128,+178]mm · 3 of 52 slices shown (1 of 2)]
[im 1/52]
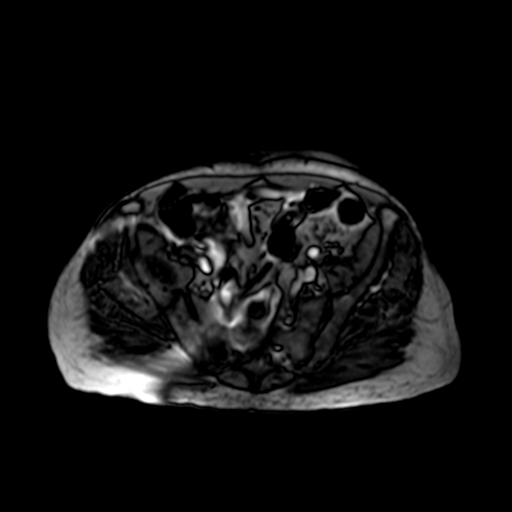
[im 26/52]
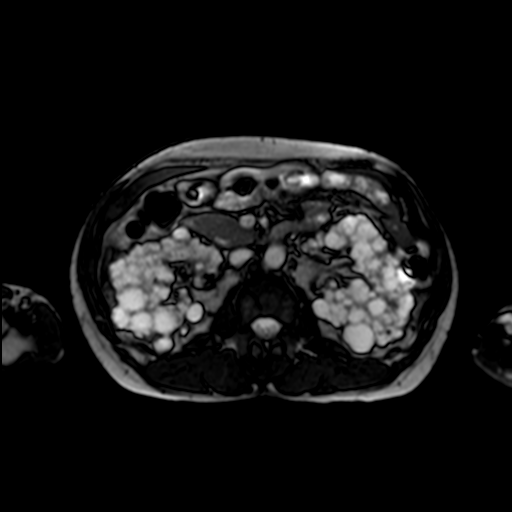
[im 52/52]
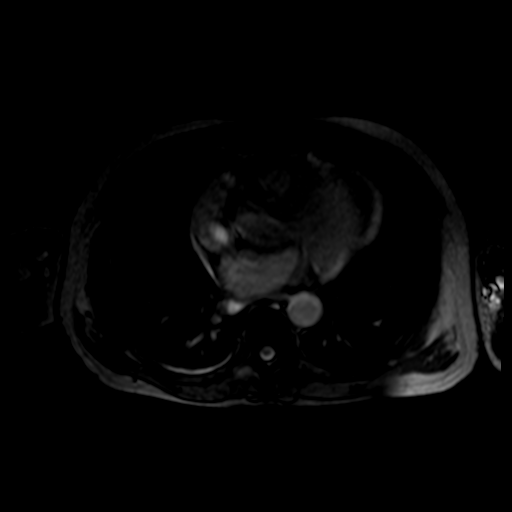

[Series 11: bSSFP · coronal · 5.0mm · 0.70mm/px · 1 of 49 slices shown (2 of 2)]
[im 1/49]
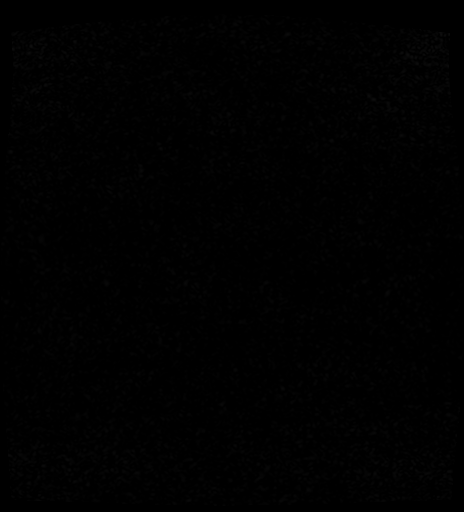

[32 of 48 positions shown; findings below may reference images not displayed]

FINDINGS: Lower chest: No acute findings.

Hepatobiliary: Marked diffuse decreased signal on the inphase images
identified compatible with iron deposition disease. No focal liver
lesion identified. Normal appearance of the gallbladder. No stones.
No biliary ductal dilatation identified.

Pancreas: No mass, inflammatory changes, or other parenchymal
abnormality identified.

Spleen: Diffuse decreased T1 and T2 signal within the splenic
parenchyma compatible with iron deposition. Numerous T2 hyperintense
foci are identified throughout the spleen. The largest arises from
the medial spleen measuring 2.4 cm.

Adrenals/Urinary Tract:  Normal appearance of the adrenal glands.

Innumerable bilateral kidney cysts are identified compatible with
polycystic kidney disease. These are incompletely characterized
without IV contrast. A number of these are complicated by hemorrhage
exhibiting increased T1 signal. The largest cyst arises from the
inferior pole of the right kidney measuring 5.8 cm, image [DATE].

Stomach/Bowel: Stomach appears normal. The visualized abdominal
bowel loops are unremarkable.

Vascular/Lymphatic: No pathologically enlarged lymph nodes
identified. No abdominal aortic aneurysm demonstrated.

Other:  No ascites or focal fluid collections.

Musculoskeletal: No suspicious bone lesions identified.
IMPRESSION: 1. Changes of polycystic kidney disease. Innumerable bilateral
kidney cysts are identified, some of which are complicated by
hemorrhage. These are incompletely characterized without IV
contrast. In this patient who is on dialysis and may not be a
candidate for contrast enhanced MRI of the abdomen a without and
with contrast CT may be considered for further evaluation of kidney
cysts.
2. Hemochromatosis which may be related to dialysis.
3. Splenic cysts.
